# Patient Record
Sex: Female | Born: 1970 | Race: Asian | Hispanic: No | Marital: Single | Smoking: Former smoker
Health system: Southern US, Community
[De-identification: ages and names within clinical notes are randomized; demographics above are authoritative.]

## PROBLEM LIST (undated history)

## (undated) DIAGNOSIS — E119 Type 2 diabetes mellitus without complications: Secondary | ICD-10-CM

## (undated) DIAGNOSIS — Z9989 Dependence on other enabling machines and devices: Secondary | ICD-10-CM

## (undated) DIAGNOSIS — R7611 Nonspecific reaction to tuberculin skin test without active tuberculosis: Secondary | ICD-10-CM

## (undated) DIAGNOSIS — G4733 Obstructive sleep apnea (adult) (pediatric): Secondary | ICD-10-CM

## (undated) DIAGNOSIS — I10 Essential (primary) hypertension: Secondary | ICD-10-CM

## (undated) HISTORY — DX: Nonspecific reaction to tuberculin skin test without active tuberculosis: R76.11

## (undated) HISTORY — DX: Essential (primary) hypertension: I10

## (undated) HISTORY — DX: Type 2 diabetes mellitus without complications: E11.9

## (undated) HISTORY — DX: Obstructive sleep apnea (adult) (pediatric): G47.33

## (undated) HISTORY — DX: Dependence on other enabling machines and devices: Z99.89

## (undated) HISTORY — PX: EYE SURGERY: SHX253

## (undated) HISTORY — PX: DENTAL SURGERY: SHX609

---

## 2009-05-29 DIAGNOSIS — R7611 Nonspecific reaction to tuberculin skin test without active tuberculosis: Secondary | ICD-10-CM

## 2009-05-29 HISTORY — DX: Nonspecific reaction to tuberculin skin test without active tuberculosis: R76.11

## 2013-07-15 ENCOUNTER — Ambulatory Visit: Payer: Self-pay | Admitting: Internal Medicine

## 2014-04-26 ENCOUNTER — Ambulatory Visit: Payer: Self-pay | Admitting: Internal Medicine

## 2014-05-04 ENCOUNTER — Ambulatory Visit: Payer: Self-pay | Admitting: Internal Medicine

## 2014-06-14 ENCOUNTER — Ambulatory Visit: Payer: Self-pay | Admitting: Internal Medicine

## 2014-11-25 ENCOUNTER — Ambulatory Visit: Payer: Self-pay | Admitting: Internal Medicine

## 2014-12-22 ENCOUNTER — Other Ambulatory Visit (INDEPENDENT_AMBULATORY_CARE_PROVIDER_SITE_OTHER): Payer: BLUE CROSS/BLUE SHIELD

## 2014-12-22 ENCOUNTER — Ambulatory Visit (INDEPENDENT_AMBULATORY_CARE_PROVIDER_SITE_OTHER): Payer: BLUE CROSS/BLUE SHIELD | Admitting: Internal Medicine

## 2014-12-22 ENCOUNTER — Encounter: Payer: Self-pay | Admitting: Internal Medicine

## 2014-12-22 ENCOUNTER — Encounter (INDEPENDENT_AMBULATORY_CARE_PROVIDER_SITE_OTHER): Payer: Self-pay

## 2014-12-22 VITALS — BP 110/70 | HR 102 | Temp 98.6°F | Resp 18 | Ht 65.0 in | Wt 245.8 lb

## 2014-12-22 DIAGNOSIS — E119 Type 2 diabetes mellitus without complications: Secondary | ICD-10-CM | POA: Diagnosis not present

## 2014-12-22 DIAGNOSIS — E669 Obesity, unspecified: Secondary | ICD-10-CM

## 2014-12-22 DIAGNOSIS — Z23 Encounter for immunization: Secondary | ICD-10-CM

## 2014-12-22 DIAGNOSIS — Z9989 Dependence on other enabling machines and devices: Secondary | ICD-10-CM

## 2014-12-22 DIAGNOSIS — E1169 Type 2 diabetes mellitus with other specified complication: Secondary | ICD-10-CM

## 2014-12-22 DIAGNOSIS — I10 Essential (primary) hypertension: Secondary | ICD-10-CM

## 2014-12-22 DIAGNOSIS — G4733 Obstructive sleep apnea (adult) (pediatric): Secondary | ICD-10-CM | POA: Diagnosis not present

## 2014-12-22 LAB — LIPID PANEL
CHOLESTEROL: 217 mg/dL — AB (ref 0–200)
HDL: 63.9 mg/dL (ref >39.00)
LDL CALC: 136 mg/dL — AB (ref 0–99)
NonHDL: 152.89
TRIGLYCERIDES: 83 mg/dL (ref 0.0–149.0)
Total CHOL/HDL Ratio: 3
VLDL: 16.6 mg/dL (ref 0.0–40.0)

## 2014-12-22 LAB — CBC
HCT: 36.3 % (ref 36.0–46.0)
Hemoglobin: 11.6 g/dL — ABNORMAL LOW (ref 12.0–15.0)
MCHC: 32 g/dL (ref 30.0–36.0)
MCV: 70.9 fl — ABNORMAL LOW (ref 78.0–100.0)
Platelets: 446 10*3/uL — ABNORMAL HIGH (ref 150.0–400.0)
RBC: 5.11 Mil/uL (ref 3.87–5.11)
RDW: 18.3 % — ABNORMAL HIGH (ref 11.5–15.5)
WBC: 7.2 10*3/uL (ref 4.0–10.5)

## 2014-12-22 LAB — COMPREHENSIVE METABOLIC PANEL
ALBUMIN: 4 g/dL (ref 3.5–5.2)
ALT: 20 U/L (ref 0–35)
AST: 15 U/L (ref 0–37)
Alkaline Phosphatase: 101 U/L (ref 39–117)
BILIRUBIN TOTAL: 0.3 mg/dL (ref 0.2–1.2)
BUN: 11 mg/dL (ref 6–23)
CALCIUM: 9.3 mg/dL (ref 8.4–10.5)
CO2: 28 mEq/L (ref 19–32)
CREATININE: 0.75 mg/dL (ref 0.40–1.20)
Chloride: 99 mEq/L (ref 96–112)
GFR: 89.61 mL/min (ref >60.00)
Glucose, Bld: 95 mg/dL (ref 70–99)
Potassium: 3.9 mEq/L (ref 3.5–5.1)
Sodium: 135 mEq/L (ref 135–145)
Total Protein: 8.4 g/dL — ABNORMAL HIGH (ref 6.0–8.3)

## 2014-12-22 LAB — HEMOGLOBIN A1C: Hgb A1c MFr Bld: 6 % (ref 4.6–6.5)

## 2014-12-22 NOTE — Progress Notes (Signed)
Pre visit review using our clinic review tool, if applicable. No additional management support is needed unless otherwise documented below in the visit note. 

## 2014-12-22 NOTE — Patient Instructions (Signed)
We will check the blood work today and call you back with the results.   Diabetes and Standards of Medical Care Diabetes is complicated. You may find that your diabetes team includes a dietitian, nurse, diabetes educator, eye doctor, and more. To help everyone know what is going on and to help you get the care you deserve, the following schedule of care was developed to help keep you on track. Below are the tests, exams, vaccines, medicines, education, and plans you will need. HbA1c test This test shows how well you have controlled your glucose over the past 2-3 months. It is used to see if your diabetes management plan needs to be adjusted.   It is performed at least 2 times a year if you are meeting treatment goals.  It is performed 4 times a year if therapy has changed or if you are not meeting treatment goals. Blood pressure test  This test is performed at every routine medical visit. The goal is less than 140/90 mm Hg for most people, but 130/80 mm Hg in some cases. Ask your health care provider about your goal. Dental exam  Follow up with the dentist regularly. Eye exam  If you are diagnosed with type 1 diabetes as a child, get an exam upon reaching the age of 10 years or older and have had diabetes for 3-5 years. Yearly eye exams are recommended after that initial eye exam.  If you are diagnosed with type 1 diabetes as an adult, get an exam within 5 years of diagnosis and then yearly.  If you are diagnosed with type 2 diabetes, get an exam as soon as possible after the diagnosis and then yearly. Foot care exam  Visual foot exams are performed at every routine medical visit. The exams check for cuts, injuries, or other problems with the feet.  A comprehensive foot exam should be done yearly. This includes visual inspection as well as assessing foot pulses and testing for loss of sensation.  Check your feet nightly for cuts, injuries, or other problems with your feet. Tell your  health care provider if anything is not healing. Kidney function test (urine microalbumin)  This test is performed once a year.  Type 1 diabetes: The first test is performed 5 years after diagnosis.  Type 2 diabetes: The first test is performed at the time of diagnosis.  A serum creatinine and estimated glomerular filtration rate (eGFR) test is done once a year to assess the level of chronic kidney disease (CKD), if present. Lipid profile (cholesterol, HDL, LDL, triglycerides)  Performed every 5 years for most people.  The goal for LDL is less than 100 mg/dL. If you are at high risk, the goal is less than 70 mg/dL.  The goal for HDL is 40 mg/dL-50 mg/dL for men and 50 mg/dL-60 mg/dL for women. An HDL cholesterol of 60 mg/dL or higher gives some protection against heart disease.  The goal for triglycerides is less than 150 mg/dL. Influenza vaccine, pneumococcal vaccine, and hepatitis B vaccine  The influenza vaccine is recommended yearly.  It is recommended that people with diabetes who are over 65 years old get the pneumonia vaccine. In some cases, two separate shots may be given. Ask your health care provider if your pneumonia vaccination is up to date.  The hepatitis B vaccine is also recommended for adults with diabetes. Diabetes self-management education  Education is recommended at diagnosis and ongoing as needed. Treatment plan  Your treatment plan is reviewed at every   medical visit. Document Released: 01/13/2009 Document Revised: 08/02/2013 Document Reviewed: 08/18/2012 ExitCare Patient Information 2015 [employer]. This information is not intended to replace advice given to you by your health care provider. Make sure you discuss any questions you have with your health care provider.  

## 2014-12-24 DIAGNOSIS — Z9989 Dependence on other enabling machines and devices: Secondary | ICD-10-CM

## 2014-12-24 DIAGNOSIS — E118 Type 2 diabetes mellitus with unspecified complications: Secondary | ICD-10-CM | POA: Insufficient documentation

## 2014-12-24 DIAGNOSIS — I10 Essential (primary) hypertension: Secondary | ICD-10-CM | POA: Insufficient documentation

## 2014-12-24 DIAGNOSIS — G4733 Obstructive sleep apnea (adult) (pediatric): Secondary | ICD-10-CM | POA: Insufficient documentation

## 2014-12-24 DIAGNOSIS — E119 Type 2 diabetes mellitus without complications: Secondary | ICD-10-CM | POA: Insufficient documentation

## 2014-12-24 NOTE — Assessment & Plan Note (Signed)
Per her reports it is under good control. Foot exam done today and reminded about yearly eye exam. She is on ACE-I, not statin. Checking lipid panel today. No known complications. Talked with her about her weight as this is the driving factor for most of her health problems.

## 2014-12-24 NOTE — Assessment & Plan Note (Signed)
Sleeps much better with her CPAP and continue.

## 2014-12-24 NOTE — Assessment & Plan Note (Signed)
BP at goal on lisinopril/hctz. Checking CMP today and adjust as indicated. No known complications of this.

## 2014-12-24 NOTE — Progress Notes (Signed)
   Subjective:    Patient ID: Karen Peters, female    DOB: 05-28-1970, 44 y.o.   MRN: [id number]  HPI The patient is a 44 YO female coming in new for continuity of her medical care. She has diabetes (well controlled on metformin, not complicated that she knows of, on ACE-I), her blood pressure (controlled on lisinopril/hctz, no known complications) and her morbid obesity (she has struggled with it for years, taking the metformin which has helped some, hard to find time to exercise, complicated by OSA on CPAP as well as diabetes and hypertension). She does not have new complaints at this time. No side effects from her medicines.   PMH, [facility], social history reviewed and updated.   Review of Systems  Constitutional: Negative for fever, activity change, appetite change, fatigue and unexpected weight change.  HENT: Negative.   Eyes: Negative.   Respiratory: Negative for cough, chest tightness, shortness of breath and wheezing.   Cardiovascular: Negative for chest pain, palpitations and leg swelling.  Gastrointestinal: Negative for abdominal pain, diarrhea, constipation and abdominal distention.  Musculoskeletal: Negative.   Skin: Negative.   Neurological: Negative for dizziness, weakness, light-headedness, numbness and headaches.  Psychiatric/Behavioral: Negative.       Objective:   Physical Exam  Constitutional: She is oriented to person, place, and time. She appears well-developed and well-nourished.  Overweight  HENT:  Head: Normocephalic and atraumatic.  Eyes: EOM are normal.  Neck: Normal range of motion.  Cardiovascular: Normal rate and regular rhythm.   Pulmonary/Chest: Effort normal and breath sounds normal. No respiratory distress. She has no wheezes.  Abdominal: Soft. Bowel sounds are normal.  Musculoskeletal: She exhibits no edema.  Neurological: She is alert and oriented to person, place, and time. Coordination normal.  Skin: Skin is warm and dry.  Foot exam done    Psychiatric: She has a normal mood and affect.   Filed Vitals:   12/22/14 1451  BP: 110/70  Pulse: 102  Temp: 98.6 F (37 C)  TempSrc: Oral  Resp: 18  Height:  (1.651 m)  Weight: 245 lb 12.8 oz (111.494 kg)  SpO2: 98%      Assessment & Plan:  Flu shot given at visit.

## 2014-12-24 NOTE — Assessment & Plan Note (Signed)
Talked to her about the seriousness of this problem. Complicated by OSA, essential hypertension and diabetes. Taking metformin which has stabilized weight but she is not exercising. Counseled her on diet and exercise as part of a healthy life.

## 2015-05-01 ENCOUNTER — Other Ambulatory Visit: Payer: Self-pay | Admitting: Internal Medicine

## 2015-05-28 ENCOUNTER — Other Ambulatory Visit: Payer: Self-pay | Admitting: Internal Medicine

## 2015-06-05 ENCOUNTER — Ambulatory Visit: Payer: BLUE CROSS/BLUE SHIELD | Admitting: Internal Medicine

## 2015-06-06 ENCOUNTER — Ambulatory Visit (INDEPENDENT_AMBULATORY_CARE_PROVIDER_SITE_OTHER): Payer: 59 | Admitting: Internal Medicine

## 2015-06-06 ENCOUNTER — Encounter: Payer: Self-pay | Admitting: Internal Medicine

## 2015-06-06 ENCOUNTER — Other Ambulatory Visit (INDEPENDENT_AMBULATORY_CARE_PROVIDER_SITE_OTHER): Payer: 59

## 2015-06-06 VITALS — BP 100/60 | HR 109 | Temp 98.0°F | Resp 18 | Ht 65.0 in | Wt 254.0 lb

## 2015-06-06 DIAGNOSIS — G4733 Obstructive sleep apnea (adult) (pediatric): Secondary | ICD-10-CM | POA: Diagnosis not present

## 2015-06-06 DIAGNOSIS — E119 Type 2 diabetes mellitus without complications: Secondary | ICD-10-CM | POA: Diagnosis not present

## 2015-06-06 DIAGNOSIS — Z9989 Dependence on other enabling machines and devices: Secondary | ICD-10-CM

## 2015-06-06 LAB — HEMOGLOBIN A1C: Hgb A1c MFr Bld: 6.2 % (ref 4.6–6.5)

## 2015-06-06 MED ORDER — CYCLOBENZAPRINE HCL 10 MG PO TABS: 10.0000 mg | ORAL_TABLET | Freq: Three times a day (TID) | ORAL | Status: DC | PRN

## 2015-06-06 MED ORDER — LEVOCETIRIZINE DIHYDROCHLORIDE 5 MG PO TABS: 5.0000 mg | ORAL_TABLET | Freq: Every evening | ORAL | Status: DC

## 2015-06-06 NOTE — Progress Notes (Signed)
   Subjective:    Patient ID: Karen Peters, female    DOB: 04/14/1970, 45 y.o.   MRN: [id number]  HPI The patient is a 45 YO female coming in for follow up of her diabetes. She is still taking the metformin and her last HgA1c was good. She has gained some weight since that time due to her job requiring her to sit for about 10 hours per day. She also gets limited time for food only and is forced to eat food that it not good for her that she can prepare quickly. She is also not able to use her cpap right now due to allergies. This is causing her to get into coughing fit and she cannot keep it on. Also feels some of the fatigue coming back from before she used it.   Review of Systems  Constitutional: Negative for fever, activity change, appetite change, fatigue and unexpected weight change.  HENT: Positive for congestion, postnasal drip and rhinorrhea. Negative for sinus pressure, tinnitus, trouble swallowing and voice change.   Eyes: Negative.   Respiratory: Negative for cough, chest tightness, shortness of breath and wheezing.   Cardiovascular: Negative for chest pain, palpitations and leg swelling.  Gastrointestinal: Negative for abdominal pain, diarrhea, constipation and abdominal distention.  Musculoskeletal: Negative.   Skin: Negative.   Neurological: Negative for dizziness, weakness, light-headedness, numbness and headaches.  Psychiatric/Behavioral: Negative.       Objective:   Physical Exam  Constitutional: She is oriented to person, place, and time. She appears well-developed and well-nourished.  Overweight  HENT:  Head: Normocephalic and atraumatic.  Eyes: EOM are normal.  Neck: Normal range of motion.  Cardiovascular: Normal rate and regular rhythm.   Pulmonary/Chest: Effort normal and breath sounds normal. No respiratory distress. She has no wheezes.  Abdominal: Soft. Bowel sounds are normal.  Musculoskeletal: She exhibits no edema.  Neurological: She is alert and oriented  to person, place, and time. Coordination normal.  Skin: Skin is warm and dry.  Psychiatric: She has a normal mood and affect.   Filed Vitals:   06/06/15 1438  BP: 100/60  Pulse: 109  Temp: 98 F (36.7 C)  TempSrc: Oral  Resp: 18  Height: 5\' 5"  (1.651 m)  Weight: 254 lb (115.214 kg)  SpO2: 95%      Assessment & Plan:

## 2015-06-06 NOTE — Patient Instructions (Signed)
We will get you in with the nutritionist to talk to them about the weight and see if they can come up with some foods that will be easier to fix.   We are checking the labs today and will send the results on mychart.   We have sent in a medicine called xyzal for the allergies that you can take daily.   We have also sent in the flexeril for the pain that is not going to make you sleepy or drowsy.

## 2015-06-06 NOTE — Assessment & Plan Note (Signed)
Rx for xyzal for she can start wearing the cpap again to help with her fatigue symptoms.

## 2015-06-06 NOTE — Assessment & Plan Note (Signed)
Weight is up some from last visit. Talked to her about suggestions for getting in some exercise at her desk, also on days off.

## 2015-06-06 NOTE — Assessment & Plan Note (Signed)
On lisinopril and metformin. Checking HgA1c today and adjust as needed. Well controlled without complications.

## 2015-06-06 NOTE — Progress Notes (Signed)
Pre visit review using our clinic review tool, if applicable. No additional management support is needed unless otherwise documented below in the visit note. 

## 2015-06-08 ENCOUNTER — Encounter: Payer: Self-pay | Admitting: Internal Medicine

## 2015-06-24 ENCOUNTER — Other Ambulatory Visit: Payer: Self-pay | Admitting: Internal Medicine

## 2015-07-12 ENCOUNTER — Ambulatory Visit: Payer: 59 | Admitting: *Deleted

## 2015-07-17 ENCOUNTER — Other Ambulatory Visit: Payer: Self-pay

## 2015-07-17 MED ORDER — LISINOPRIL-HYDROCHLOROTHIAZIDE 10-12.5 MG PO TABS: 0.5000 | ORAL_TABLET | Freq: Every day | ORAL | Status: DC

## 2015-07-21 ENCOUNTER — Ambulatory Visit (HOSPITAL_BASED_OUTPATIENT_CLINIC_OR_DEPARTMENT_OTHER)
Admission: RE | Admit: 2015-07-21 | Discharge: 2015-07-21 | Disposition: A | Discharge status: Home / Self Care | Payer: 59 | Source: Ambulatory Visit | Attending: Family Medicine | Admitting: Family Medicine

## 2015-07-21 ENCOUNTER — Ambulatory Visit (INDEPENDENT_AMBULATORY_CARE_PROVIDER_SITE_OTHER): Payer: 59

## 2015-07-21 ENCOUNTER — Ambulatory Visit (INDEPENDENT_AMBULATORY_CARE_PROVIDER_SITE_OTHER): Payer: 59 | Admitting: Family Medicine

## 2015-07-21 VITALS — BP 128/86 | HR 93 | Temp 98.0°F | Resp 18 | Ht 64.5 in | Wt 257.0 lb

## 2015-07-21 DIAGNOSIS — R1011 Right upper quadrant pain: Secondary | ICD-10-CM

## 2015-07-21 DIAGNOSIS — R350 Frequency of micturition: Secondary | ICD-10-CM

## 2015-07-21 DIAGNOSIS — R932 Abnormal findings on diagnostic imaging of liver and biliary tract: Secondary | ICD-10-CM | POA: Diagnosis not present

## 2015-07-21 LAB — COMPREHENSIVE METABOLIC PANEL
ALK PHOS: 107 U/L (ref 33–115)
ALT: 38 U/L — AB (ref 6–29)
AST: 22 U/L (ref 10–30)
Albumin: 4 g/dL (ref 3.6–5.1)
BUN: 11 mg/dL (ref 7–25)
CALCIUM: 8.9 mg/dL (ref 8.6–10.2)
CO2: 26 mmol/L (ref 20–31)
Chloride: 99 mmol/L (ref 98–110)
Creat: 0.69 mg/dL (ref 0.50–1.10)
GLUCOSE: 93 mg/dL (ref 65–99)
POTASSIUM: 3.9 mmol/L (ref 3.5–5.3)
Sodium: 136 mmol/L (ref 135–146)
TOTAL PROTEIN: 7.5 g/dL (ref 6.1–8.1)
Total Bilirubin: 0.3 mg/dL (ref 0.2–1.2)

## 2015-07-21 LAB — POCT CBC
Granulocyte percent: 56.4 %G (ref 37–80)
HEMATOCRIT: 35.8 % — AB (ref 37.7–47.9)
HEMOGLOBIN: 12 g/dL — AB (ref 12.2–16.2)
LYMPH, POC: 2.8 (ref 0.6–3.4)
MCH, POC: 25.9 pg — AB (ref 27–31.2)
MCHC: 33.6 g/dL (ref 31.8–35.4)
MCV: 77 fL — AB (ref 80–97)
MID (cbc): 0.6 (ref 0–0.9)
MPV: 7.2 fL (ref 0–99.8)
POC GRANULOCYTE: 4.5 (ref 2–6.9)
POC LYMPH PERCENT: 35.5 %L (ref 10–50)
POC MID %: 8.1 % (ref 0–12)
Platelet Count, POC: 295 10*3/uL (ref 142–424)
RBC: 4.65 M/uL (ref 4.04–5.48)
RDW, POC: 16.3 %
WBC: 8 10*3/uL (ref 4.6–10.2)

## 2015-07-21 LAB — POC MICROSCOPIC URINALYSIS (UMFC): MUCUS RE: ABSENT

## 2015-07-21 LAB — POCT URINALYSIS DIP (MANUAL ENTRY)
Bilirubin, UA: NEGATIVE
GLUCOSE UA: NEGATIVE
Ketones, POC UA: NEGATIVE
NITRITE UA: NEGATIVE
Protein Ur, POC: NEGATIVE
Spec Grav, UA: 1.015
UROBILINOGEN UA: 0.2
pH, UA: 5.5

## 2015-07-21 LAB — POCT URINE PREGNANCY: Preg Test, Ur: NEGATIVE

## 2015-07-21 LAB — LIPASE: LIPASE: 14 U/L (ref 7–60)

## 2015-07-21 MED ORDER — HYDROCODONE-ACETAMINOPHEN 5-325 MG PO TABS: 1.0000 | ORAL_TABLET | Freq: Four times a day (QID) | ORAL | Status: DC | PRN

## 2015-07-21 MED ORDER — KETOROLAC TROMETHAMINE 60 MG/2ML IM SOLN
60.0000 mg | Freq: Once | INTRAMUSCULAR | Status: AC
  Administered 2015-07-21: 60 mg via INTRAMUSCULAR

## 2015-07-21 NOTE — Progress Notes (Addendum)
Subjective:    Patient ID: Karen Peters, female    DOB: [birth year], 45 y.o.   MRN: [id number] By signing my name below, I, Karen Peters, attest that this documentation has been prepared under the direction and in the presence of Karen SorensonEva Carra Brindley, MD.  Electronically Signed: Littie Deedsichard Peters, Medical Scribe. 07/21/2015. 3:19 PM.  Chief Complaint  Patient presents with  . Abdominal Pain    right side radiating to back and lower abdomen, frequent urination    HPI HPI Comments: Karen SerRachelle Ruegg is a 45 y.o. female who presents to the [facility] complaining of intermittent, shooting right flank pain radiating around to her back and abdomen, underneath the breast, that started yesterday evening after dinner (she had fried chicken for dinner). Patient also reports having mild dizziness. She does not have any pain when she remains still. The pain is worse with movement and deep breathing. She tried tramadol around 1:00 AM this morning without relief. She has not tried ibuprofen. Patient denies nausea and blood in stool. No prior abdominal surgeries per patient. She has been having normal bowel movements. She has been drinking fluids normally, but she notes that her urine has appeared darker than normal. She has not eaten since dinner last night. Patient states she had a minor fall a few days ago after she had slipped and hit her left side against a part of a chair, but she did not have any pain to her right side as a result. She has not had a recent pelvic exam. Patient states she is currently being treated for uterine fibroids by her PCP. She is not on any birth control. She is not married nor sexually active, and she denies possibility of pregnancy.   Depression screen PHQ 2/9 07/21/2015  Decreased Interest 0  Down, Depressed, Hopeless 0  PHQ - 2 Score 0      Past Medical History  Diagnosis Date  . Diabetes mellitus without complication (HCC)   . Hypertension   . 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 OSA on CPAP    Past Surgical History  Procedure Laterality Date  . Eye surgery    . Dental surgery     Current Outpatient Prescriptions on File Prior to Visit  Medication Sig Dispense Refill  . clobetasol (TEMOVATE) 0.05 % external solution Apply 1 application topically.     Marland Kitchen. levocetirizine (XYZAL) 5 MG tablet Take 1 tablet (5 mg total) by mouth every evening. 30 tablet 3  . lisinopril-hydrochlorothiazide (PRINZIDE,ZESTORETIC) 10-12.5 MG tablet Take 0.5 tablets by mouth daily. 15 tablet 2  . metFORMIN (GLUCOPHAGE) 500 MG tablet TAKE 1 TABLET BY MOUTH EVERY DAY 30 tablet 1  . traMADol (ULTRAM) 50 MG tablet Take 50 mg by mouth every 6 (six) hours as needed.      No current facility-administered medications on file prior to visit.   No Known Allergies Family History  Problem Relation Age of Onset  . Hypertension Mother   . Diabetes Mother   . Hyperlipidemia Mother   . Stroke Maternal Uncle   . Cancer Maternal Grandmother    Social History   Social History  . Marital Status: Single    Spouse Name: N/A  . Number of Children: N/A  . Years of Education: N/A   Social History Main Topics  . Smoking status: Former Games developermoker  . Smokeless tobacco: None  . Alcohol Use: 0.0 oz/week    0 Standard drinks or equivalent per week  . Drug Use:  No  . Sexual Activity: Not Asked   Other Topics Concern  . None   Social History Narrative     Review of Systems  Constitutional: Positive for appetite change and fatigue. Negative for fever, chills, diaphoresis and activity change.  Respiratory: Positive for chest tightness.   Cardiovascular: Positive for chest pain.  Gastrointestinal: Positive for abdominal pain. Negative for nausea, diarrhea, constipation and blood in stool.  Genitourinary: Positive for hematuria, flank pain, vaginal bleeding and menstrual problem.  Musculoskeletal: Positive for back pain.  Neurological: Positive for dizziness.  Hematological: Negative for  adenopathy. Does not bruise/bleed easily.  Psychiatric/Behavioral: Negative for dysphoric mood.       Objective:  BP 128/86 mmHg  Pulse 93  Temp(Src) 98 F (36.7 C) (Oral)  Resp 18  Ht 5' 4.5" (1.638 m)  Wt 257 lb (116.574 kg)  BMI 43.45 kg/m2  SpO2 97%  LMP 06/23/2015  Physical Exam  Constitutional: She is oriented to person, place, and time. She appears well-developed and well-nourished. No distress.  HENT:  Head: Normocephalic and atraumatic.  Mouth/Throat: Oropharynx is clear and moist. No oropharyngeal exudate.  Eyes: Pupils are equal, round, and reactive to light.  Neck: Neck supple. No thyromegaly present.  Thyroid normal.  Cardiovascular: Normal rate, regular rhythm, S1 normal, S2 normal and normal heart sounds.   No murmur heard. Pulmonary/Chest: Effort normal and breath sounds normal. No respiratory distress. She has no wheezes. She has no rales.  Clear to auscultation bilaterally. Good air movement.  Abdominal: There is tenderness. There is no CVA tenderness.  Hypoactive to absent bowel sounds. Diffuse generalized tenderness sparing the periumbilical and epigastric area.  Genitourinary:  Normal labia bimanual exam. No CMT. No tenderness with uterine or adnexa palpation. Body habitus inhibits assessment of masses or enlargement.  Musculoskeletal: She exhibits no edema.  Small muscle spasm over the upper right flank, base of scapula.  Neurological: She is alert and oriented to person, place, and time. No cranial nerve deficit.  Skin: Skin is warm and dry. No rash noted.  Psychiatric: She has a normal mood and affect. Her behavior is normal.  Nursing note and vitals reviewed.         Assessment & Plan:   1. Frequent urination   2. Abdominal pain, right upper quadrant   Suspect gallstones but could be MSK  Orders Placed This Encounter  Procedures  . DG Abd Acute W/Chest    Standing Status: Future     Number of Occurrences: 1     Standing Expiration Date:  07/20/2016    Order Specific Question:  Reason for exam:    Answer:  right flank pain, diffuse abd pain, suspect cholecystitis    Order Specific Question:  Is the patient pregnant?    Answer:  No    Order Specific Question:  Preferred imaging location?    Answer:  External  . US Abdomen Limited RUQ    Standing Status: Future     Number of Occurrences: 1     Standing Expiration Date: 09/19/2016    Order Specific Question:  Reason for Exam (SYMPTOM  OR DIAGNOSIS REQUIRED)    Answer:  SUSPECT CHOLECYSTITIS    Order Specific Question:  Preferred imaging location?    Answer:  External  . Comprehensive metabolic panel  . Lipase  . POCT Microscopic Urinalysis ([facility])  . POCT urinalysis dipstick  . POCT CBC  . POCT urine pregnancy    Meds ordered this encounter  Medications  . ketorolac (  TORADOL) injection 60 mg    Sig:   . HYDROcodone-acetaminophen (NORCO/VICODIN) 5-325 MG tablet    Sig: Take 1-2 tablets by mouth every 6 (six) hours as needed for moderate pain.    Dispense:  20 tablet    Refill:  0  . cyclobenzaprine (FLEXERIL) 10 MG tablet    Sig: Take 1 tablet (10 mg total) by mouth 3 (three) times daily as needed for muscle spasms.    Dispense:  30 tablet    Refill:  0    I personally performed the services described in this documentation, which was scribed in my presence. The recorded information has been reviewed and considered, and addended by me as needed.  Karen Sorenson, MD MPH

## 2015-07-21 NOTE — Patient Instructions (Addendum)
Go to [facility] for outpatient U/S. Register at outpatient side if they are still open when you get there. If not register at the emergency department as OUTPATIENT U/S. DO NOT REGISTER AS ED PATIENT     IF you received an x-ray today, you will receive an invoice from [facility]. Please contact [facility] at [phone] with questions or concerns regarding your invoice.   IF you received labwork today, you will receive an invoice from [employer] [facility]. Please contact [facility] at [phone] with questions or concerns regarding your invoice.   Our billing staff will not be able to assist you with questions regarding bills from these companies.  You will be contacted with the lab results as soon as they are available. The fastest way to get your results is to activate your My Chart account. Instructions are located on the last page of this paperwork. If you have not heard from [place] regarding the results in 2 weeks, please contact this office.    Low-Fat Diet for Pancreatitis or Gallbladder Conditions A low-fat diet can be helpful if you have pancreatitis or a gallbladder condition. With these conditions, your pancreas and gallbladder have trouble digesting fats. A healthy eating plan with less fat will help rest your pancreas and gallbladder and reduce your symptoms. WHAT DO I NEED TO KNOW ABOUT THIS DIET?  Eat a low-fat diet.  Reduce your fat intake to less than 20-30% of your total daily calories. This is less than 50-60 g of fat per day.  Remember that you need some fat in your diet. Ask your dietician what your daily goal should be.  Choose nonfat and low-fat healthy foods. Look for the words "nonfat," "low fat," or "fat free."  As a guide, look on the label and choose foods with less than 3 g of fat per serving. Eat only one serving.  Avoid alcohol.  Do not smoke. If you need help quitting, talk with your health care provider.  Eat  small frequent meals instead of three large heavy meals. WHAT FOODS CAN I EAT? Grains Include healthy grains and starches such as potatoes, wheat bread, fiber-rich cereal, and brown rice. Choose whole grain options whenever possible. In adults, whole grains should account for 45-65% of your daily calories.  Fruits and Vegetables Eat plenty of fruits and vegetables. Fresh fruits and vegetables add fiber to your diet. Meats and Other Protein Sources Eat lean meat such as chicken and pork. Trim any fat off of meat before cooking it. Eggs, fish, and beans are other sources of protein. In adults, these foods should account for 10-35% of your daily calories. Dairy Choose low-fat milk and dairy options. Dairy includes fat and protein, as well as calcium.  Fats and Oils Limit high-fat foods such as fried foods, sweets, baked goods, sugary drinks.  Other Creamy sauces and condiments, such as mayonnaise, can add extra fat. Think about whether or not you need to use them, or use smaller amounts or low fat options. WHAT FOODS ARE NOT RECOMMENDED?  High fat foods, such as:  [employer].  Ice cream.  [place] toast.  Sweet rolls.  Pizza.  Cheese bread.  Foods covered with batter, butter, creamy sauces, or cheese.  Fried foods.  Sugary drinks and desserts.  Foods that cause gas or bloating   This information is not intended to replace advice given to you by your health care provider. Make sure you discuss any questions you have with your health care  provider.   Document Released: 03/23/2013 Document Reviewed: 03/23/2013 Elsevier Interactive Patient Education [employer]2016 Elsevier Inc.

## 2015-07-22 ENCOUNTER — Telehealth: Payer: Self-pay

## 2015-07-22 ENCOUNTER — Encounter: Payer: Self-pay | Admitting: Family Medicine

## 2015-07-22 MED ORDER — CYCLOBENZAPRINE HCL 10 MG PO TABS: 10.0000 mg | ORAL_TABLET | Freq: Three times a day (TID) | ORAL | Status: DC | PRN

## 2015-07-22 NOTE — Telephone Encounter (Signed)
Pt needs a doctors note that says she was seen Friday and was out of work for that reason.  She needs this for Monday if possible.  She also put in a MyChart message and wanted to know if we could send the letter through it.  [phone]

## 2015-07-25 NOTE — Telephone Encounter (Signed)
Letter printed. Advised to call back. I may need a fax number instead.

## 2015-08-25 ENCOUNTER — Encounter: Payer: Self-pay | Admitting: Internal Medicine

## 2015-08-28 ENCOUNTER — Other Ambulatory Visit: Payer: Self-pay | Admitting: Internal Medicine

## 2015-09-02 ENCOUNTER — Encounter: Payer: Self-pay | Admitting: Internal Medicine

## 2015-09-04 ENCOUNTER — Other Ambulatory Visit: Payer: Self-pay | Admitting: Geriatric Medicine

## 2015-09-04 MED ORDER — METFORMIN HCL 500 MG PO TABS: 500.0000 mg | ORAL_TABLET | Freq: Every day | ORAL | Status: DC

## 2015-09-04 MED ORDER — LISINOPRIL-HYDROCHLOROTHIAZIDE 10-12.5 MG PO TABS: 0.5000 | ORAL_TABLET | Freq: Every day | ORAL | Status: DC

## 2015-09-04 MED ORDER — LEVOCETIRIZINE DIHYDROCHLORIDE 5 MG PO TABS
5.0000 mg | ORAL_TABLET | Freq: Every evening | ORAL | Status: DC
Start: 2015-09-04 — End: 2016-09-28

## 2015-12-01 ENCOUNTER — Other Ambulatory Visit (INDEPENDENT_AMBULATORY_CARE_PROVIDER_SITE_OTHER): Payer: 59

## 2015-12-01 ENCOUNTER — Encounter: Payer: Self-pay | Admitting: Internal Medicine

## 2015-12-01 ENCOUNTER — Ambulatory Visit (INDEPENDENT_AMBULATORY_CARE_PROVIDER_SITE_OTHER): Payer: 59 | Admitting: Internal Medicine

## 2015-12-01 VITALS — BP 110/76 | HR 72 | Temp 98.1°F | Resp 12 | Ht 65.0 in | Wt 246.0 lb

## 2015-12-01 DIAGNOSIS — E119 Type 2 diabetes mellitus without complications: Secondary | ICD-10-CM

## 2015-12-01 DIAGNOSIS — Z23 Encounter for immunization: Secondary | ICD-10-CM | POA: Diagnosis not present

## 2015-12-01 LAB — COMPREHENSIVE METABOLIC PANEL
ALBUMIN: 4.1 g/dL (ref 3.5–5.2)
ALK PHOS: 109 U/L (ref 39–117)
ALT: 39 U/L — ABNORMAL HIGH (ref 0–35)
AST: 26 U/L (ref 0–37)
BUN: 10 mg/dL (ref 6–23)
CALCIUM: 9.3 mg/dL (ref 8.4–10.5)
CHLORIDE: 98 meq/L (ref 96–112)
CO2: 31 mEq/L (ref 19–32)
CREATININE: 0.75 mg/dL (ref 0.40–1.20)
GFR: 89.22 mL/min (ref >60.00)
Glucose, Bld: 108 mg/dL — ABNORMAL HIGH (ref 70–99)
Potassium: 3.7 mEq/L (ref 3.5–5.1)
Sodium: 136 mEq/L (ref 135–145)
TOTAL PROTEIN: 8.2 g/dL (ref 6.0–8.3)
Total Bilirubin: 0.4 mg/dL (ref 0.2–1.2)

## 2015-12-01 LAB — LIPID PANEL
CHOLESTEROL: 232 mg/dL — AB (ref 0–200)
HDL: 52 mg/dL (ref >39.00)
LDL CALC: 156 mg/dL — AB (ref 0–99)
NonHDL: 180.4
TRIGLYCERIDES: 122 mg/dL (ref 0.0–149.0)
Total CHOL/HDL Ratio: 4
VLDL: 24.4 mg/dL (ref 0.0–40.0)

## 2015-12-01 LAB — HEMOGLOBIN A1C: Hgb A1c MFr Bld: 6.2 % (ref 4.6–6.5)

## 2015-12-01 NOTE — Assessment & Plan Note (Signed)
She is down about 10 pounds and working on portion control which is helping. Exercising some and encouraged her to continue.

## 2015-12-01 NOTE — Progress Notes (Signed)
Pre visit review using our clinic review tool, if applicable. No additional management support is needed unless otherwise documented below in the visit note. 

## 2015-12-01 NOTE — Patient Instructions (Signed)
We have given you the flu shot today.  We are checking the blood work and if the sugars are good we will decrease the metformin to 1/2 pill a day.

## 2015-12-01 NOTE — Progress Notes (Signed)
   Subjective:    Patient ID: Karen Peters, female    DOB: 29-Sep-1970, 45 y.o.   MRN: [id number]  HPI The patient is a 45 YO female coming in for follow up of her diabetes. She is still having some GI side effects from the metformin. She has moved the dose to evening with her job but still having some loose stools. She will keep taking it as she wants to control her sugars. No burning or numbness in her feet. No chest pains or SOB.   Review of Systems  Constitutional: Negative for activity change, appetite change, fatigue, fever and unexpected weight change.  HENT: Positive for congestion. Negative for postnasal drip, rhinorrhea, sinus pressure, tinnitus, trouble swallowing and voice change.   Eyes: Negative.   Respiratory: Negative for cough, chest tightness, shortness of breath and wheezing.   Cardiovascular: Negative for chest pain, palpitations and leg swelling.  Gastrointestinal: Negative for abdominal distention, abdominal pain, constipation and diarrhea.  Musculoskeletal: Negative.   Neurological: Negative for dizziness, weakness, light-headedness, numbness and headaches.      Objective:   Physical Exam  Constitutional: She is oriented to person, place, and time. She appears well-developed and well-nourished.  Overweight  HENT:  Head: Normocephalic and atraumatic.  Eyes: EOM are normal.  Neck: Normal range of motion.  Cardiovascular: Normal rate and regular rhythm.   Pulmonary/Chest: Effort normal and breath sounds normal. No respiratory distress. She has no wheezes.  Abdominal: Soft. Bowel sounds are normal. She exhibits no distension. There is no tenderness.  Musculoskeletal: She exhibits no edema.  Neurological: She is alert and oriented to person, place, and time. Coordination normal.  Skin: Skin is warm and dry.   Vitals:   12/01/15 1042  BP: 110/76  Pulse: 72  Resp: 12  Temp: 98.1 F (36.7 C)  TempSrc: Oral  SpO2: 98%  Weight: 246 lb (111.6 kg)  Height: 5\' 5"   (1.651 m)      Assessment & Plan:  Flu shot given at visit.

## 2015-12-01 NOTE — Assessment & Plan Note (Signed)
Checking HgA1c, CMP, lipid panel and adjust as needed. Taking metformin 500 mg qhs and still having some stomach symptoms. If HgA1c still <6.5 will reduce to 250 mg nightly. Not complicated.

## 2016-04-16 DIAGNOSIS — J209 Acute bronchitis, unspecified: Secondary | ICD-10-CM | POA: Diagnosis not present

## 2016-06-05 ENCOUNTER — Encounter: Payer: Self-pay | Admitting: Internal Medicine

## 2016-06-05 ENCOUNTER — Ambulatory Visit (INDEPENDENT_AMBULATORY_CARE_PROVIDER_SITE_OTHER): Payer: 59 | Admitting: Internal Medicine

## 2016-06-05 ENCOUNTER — Other Ambulatory Visit (INDEPENDENT_AMBULATORY_CARE_PROVIDER_SITE_OTHER): Payer: 59

## 2016-06-05 VITALS — BP 130/70 | HR 89 | Temp 98.1°F | Ht 65.0 in | Wt 255.0 lb

## 2016-06-05 DIAGNOSIS — E119 Type 2 diabetes mellitus without complications: Secondary | ICD-10-CM

## 2016-06-05 LAB — HEMOGLOBIN A1C: HEMOGLOBIN A1C: 6.2 % (ref 4.6–6.5)

## 2016-06-05 NOTE — Patient Instructions (Signed)
We will have you stop the metformin and if needed we can send in another medicine to replace it.   We will check the labs today.   Buspar is a very safe medicine we can try if you need it.

## 2016-06-05 NOTE — Assessment & Plan Note (Signed)
Checking HgA1c today, will stop metformin and if HgA1c high can restart a different medication. Not complicated. Foot exam done.

## 2016-06-05 NOTE — Progress Notes (Signed)
Pre visit review using our clinic review tool, if applicable. No additional management support is needed unless otherwise documented below in the visit note. 

## 2016-06-05 NOTE — Progress Notes (Signed)
   Subjective:    Patient ID: Karen Peters, female    DOB: [birth year], 46 y.o.   MRN: [id number]  HPI The patient is a 46 YO female coming in for follow up of her diabetes (taking metformin, on ACE-I, not complicated). She denies numbness or burning in her feet. 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 She is not wanting to try anything for this but maybe if it does not go away. This is causing her some diarrhea and she wonders if her metformin could be making this worse.   Review of Systems  Constitutional: Negative.   Respiratory: Negative.   Cardiovascular: Negative.   Gastrointestinal: Positive for diarrhea. Negative for abdominal distention, abdominal pain, constipation, nausea and vomiting.  Musculoskeletal: Negative.   Neurological: Negative.   Psychiatric/Behavioral: Positive for dysphoric mood. The patient is nervous/anxious.       Objective:   Physical Exam  Constitutional: She is oriented to person, place, and time. She appears well-developed and well-nourished.  HENT:  Head: Normocephalic and atraumatic.  Eyes: EOM are normal.  Neck: Normal range of motion.  Cardiovascular: Normal rate and regular rhythm.   Pulmonary/Chest: Effort normal and breath sounds normal. No respiratory distress. She has no wheezes. She has no rales.  Abdominal: Soft. Bowel sounds are normal. She exhibits no distension. There is no tenderness. There is no rebound.  Musculoskeletal: She exhibits no edema.  Neurological: She is alert and oriented to person, place, and time. Coordination normal.  Skin: Skin is warm and dry.  See foot exam   Vitals:   06/05/16 1004  BP: 130/70  Pulse: 89  Temp: 98.1 F (36.7 C)  TempSrc: Oral  SpO2: 99%  Weight: 255 lb (115.7 kg)  Height: 5\' 5"  (1.651 m)      Assessment & Plan:

## 2016-07-03 ENCOUNTER — Encounter: Payer: Self-pay | Admitting: Internal Medicine

## 2016-07-04 MED ORDER — METFORMIN HCL ER 500 MG PO TB24: 500.0000 mg | ORAL_TABLET | Freq: Every day | ORAL | 3 refills | Status: DC

## 2016-07-30 ENCOUNTER — Encounter: Payer: Self-pay | Admitting: Internal Medicine

## 2016-07-30 ENCOUNTER — Telehealth: Payer: Self-pay | Admitting: Emergency Medicine

## 2016-07-30 ENCOUNTER — Ambulatory Visit (INDEPENDENT_AMBULATORY_CARE_PROVIDER_SITE_OTHER): Payer: 59 | Admitting: Internal Medicine

## 2016-07-30 VITALS — BP 132/84 | HR 100 | Temp 98.6°F | Resp 12 | Ht 65.0 in | Wt 252.0 lb

## 2016-07-30 DIAGNOSIS — J0110 Acute frontal sinusitis, unspecified: Secondary | ICD-10-CM | POA: Diagnosis not present

## 2016-07-30 DIAGNOSIS — R350 Frequency of micturition: Secondary | ICD-10-CM

## 2016-07-30 LAB — POC URINALSYSI DIPSTICK (AUTOMATED)
BILIRUBIN UA: NEGATIVE
Blood, UA: NEGATIVE
Glucose, UA: NEGATIVE
KETONES UA: NEGATIVE
LEUKOCYTES UA: NEGATIVE
NITRITE UA: NEGATIVE
Protein, UA: NEGATIVE
Spec Grav, UA: 1.015 (ref 1.010–1.025)
Urobilinogen, UA: 0.2 E.U./dL
pH, UA: 6 (ref 5.0–8.0)

## 2016-07-30 MED ORDER — METFORMIN HCL ER 500 MG PO TB24: 500.0000 mg | ORAL_TABLET | Freq: Every day | ORAL | 3 refills | Status: DC

## 2016-07-30 MED ORDER — PREDNISONE 20 MG PO TABS: 40.0000 mg | ORAL_TABLET | Freq: Every day | ORAL | 0 refills | Status: DC

## 2016-07-30 NOTE — Patient Instructions (Signed)
We have sent in the prednisone for the sinuses. Take 2 pills a day for 5 days.   You can take the benadryl at night time to help with symptoms.

## 2016-07-30 NOTE — Progress Notes (Signed)
   Subjective:    Patient ID: Karen Peters, female    DOB: 05-24-1970, 46 y.o.   MRN: [id number]  HPI The patient is a 46 YO female coming in for UTI symptoms for about 3 days. Having some frequency, no urgency, no burning. No abdominal pain, no flank or back pain. Overall stable to mildly improving. Denies blood in urine or changes.  She is also having some severe allergy symptoms for about 2 weeks. She denies fevers but is having chills for the last week or so. She is having sinus pressure and pain with headaches. Taking some ibuprofen for headaches which is helping only minimally. She is taking xyzal daily and has not missed. [place] the [place] this weekend and she is not sure if she was exposed to different allergens. She is having a lot of nose drainage as well which is mostly clear but some yellow.   Review of Systems  Constitutional: Positive for activity change and chills. Negative for appetite change, diaphoresis, fatigue, fever and unexpected weight change.  HENT: Positive for congestion, ear pain, postnasal drip, rhinorrhea, sinus pain, sinus pressure and sore throat. Negative for ear discharge, nosebleeds and trouble swallowing.   Eyes: Negative.   Respiratory: Positive for cough. Negative for chest tightness, shortness of breath and wheezing.   Cardiovascular: Negative.   Gastrointestinal: Negative.   Musculoskeletal: Negative.   Skin: Negative.   Neurological: Negative.   Psychiatric/Behavioral: Negative.       Objective:   Physical Exam  Constitutional: She is oriented to person, place, and time. She appears well-developed and well-nourished.  HENT:  Head: Normocephalic and atraumatic.  Frontal sinus pressure, TMs bulging, oropharynx with redness and drainage, nose with crusting.   Eyes: EOM are normal.  Neck: Normal range of motion.  Cardiovascular: Normal rate and regular rhythm.   Pulmonary/Chest: Effort normal and breath sounds normal. No respiratory  distress. She has no wheezes. She has no rales.  Abdominal: Soft. She exhibits no distension. There is no tenderness. There is no rebound.  Musculoskeletal: She exhibits no edema.  Neurological: She is alert and oriented to person, place, and time. Coordination normal.  Skin: Skin is warm and dry.   Vitals:   07/30/16 1612  BP: 132/84  Pulse: 100  Resp: 12  Temp: 98.6 F (37 C)  TempSrc: Oral  SpO2: 99%  Weight: 252 lb (114.3 kg)  Height:  (1.651 m)      Assessment & Plan:

## 2016-07-30 NOTE — Telephone Encounter (Signed)
Pt asked if she can get a refill on her traMADol (ULTRAM) 50 MG tablet. It hasnt been filled in a while so if she needs an appt she will make one. Please advise thanks.

## 2016-07-30 NOTE — Progress Notes (Signed)
Pre visit review using our clinic review tool, if applicable. No additional management support is needed unless otherwise documented below in the visit note. 

## 2016-07-31 DIAGNOSIS — J329 Chronic sinusitis, unspecified: Secondary | ICD-10-CM | POA: Insufficient documentation

## 2016-07-31 DIAGNOSIS — R350 Frequency of micturition: Secondary | ICD-10-CM | POA: Insufficient documentation

## 2016-07-31 NOTE — Assessment & Plan Note (Signed)
Rx for prednisone for persistent allergy symptoms. No clear indication for antibiotics today and will hold off and advised to continue taking xyzal daily.

## 2016-07-31 NOTE — Assessment & Plan Note (Signed)
U/A done in the office today which is without signs of infection. No treatment indication and advised to increase water intake to help.

## 2016-07-31 NOTE — Telephone Encounter (Signed)
LVM stating she needs a visit

## 2016-07-31 NOTE — Telephone Encounter (Signed)
Pease advise.

## 2016-07-31 NOTE — Telephone Encounter (Signed)
Since we have never filled would need visit as this is controlled.

## 2016-09-28 ENCOUNTER — Other Ambulatory Visit: Payer: Self-pay | Admitting: Internal Medicine

## 2016-10-01 IMAGING — CR DG ABDOMEN ACUTE W/ 1V CHEST
4 series · 4 of 4 positions shown · non-contrast
Comparison: None.

CLINICAL DATA: 44-year-old female with a history of right-sided
abdominal pain.

EXAM:
DG ABDOMEN ACUTE W/ 1V CHEST

[PA]
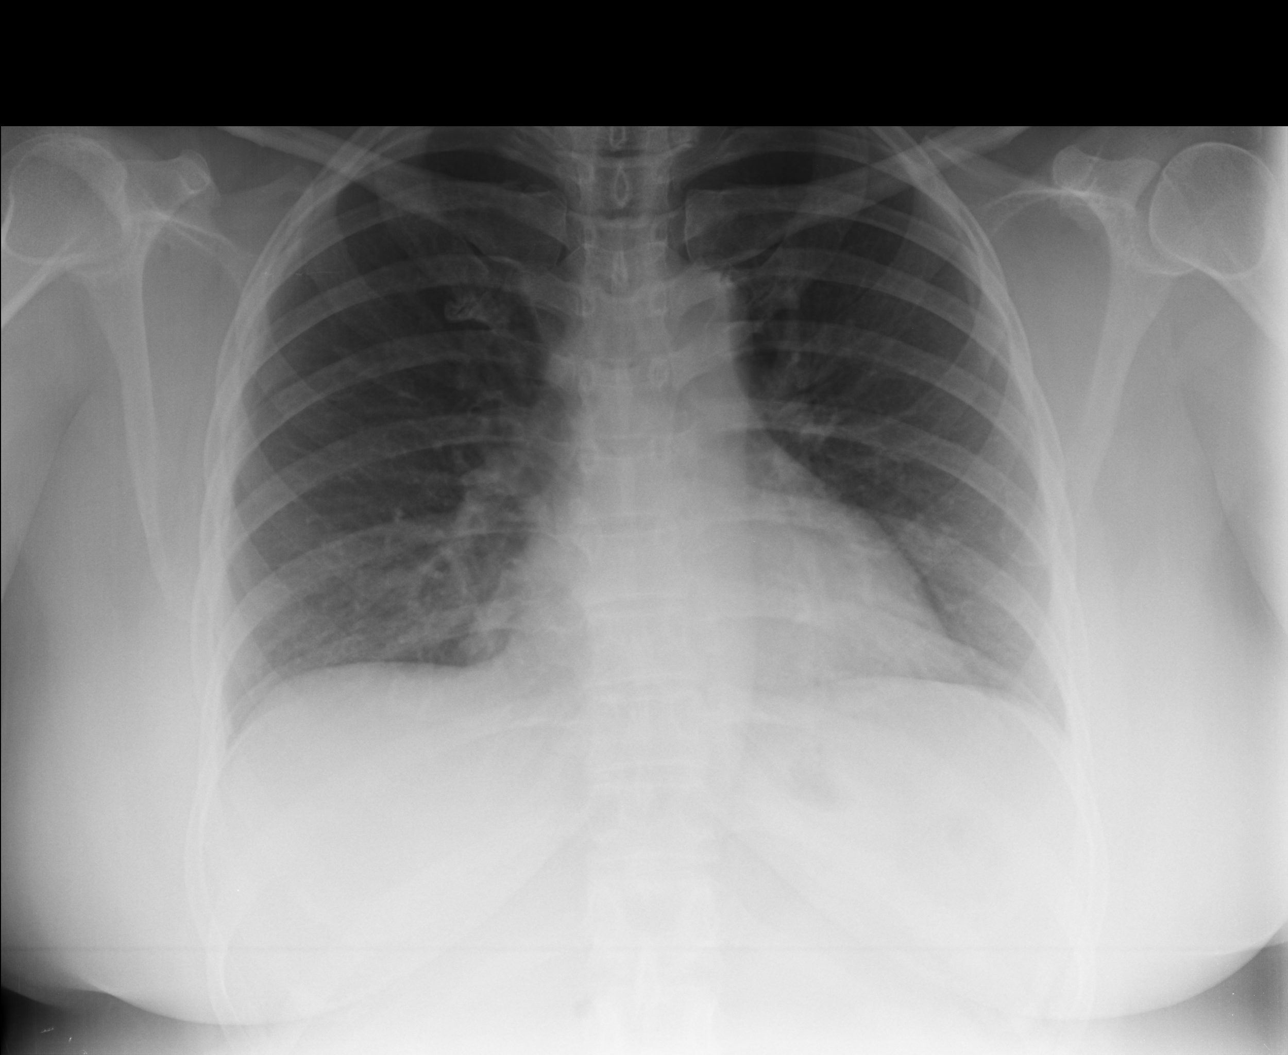

[AP (1 of 3)]
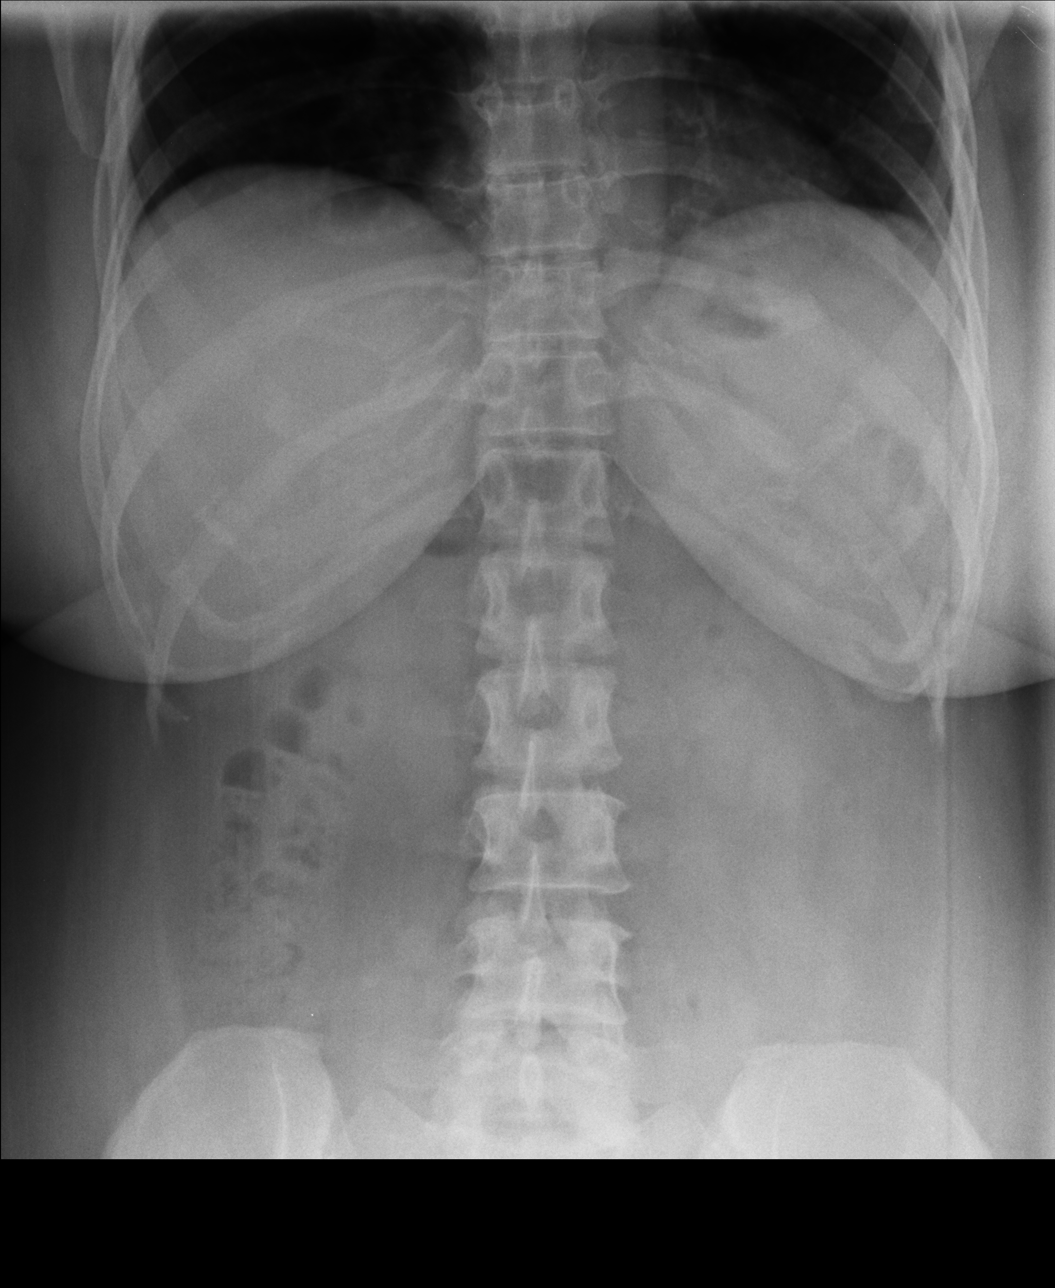

[AP (2 of 3)]
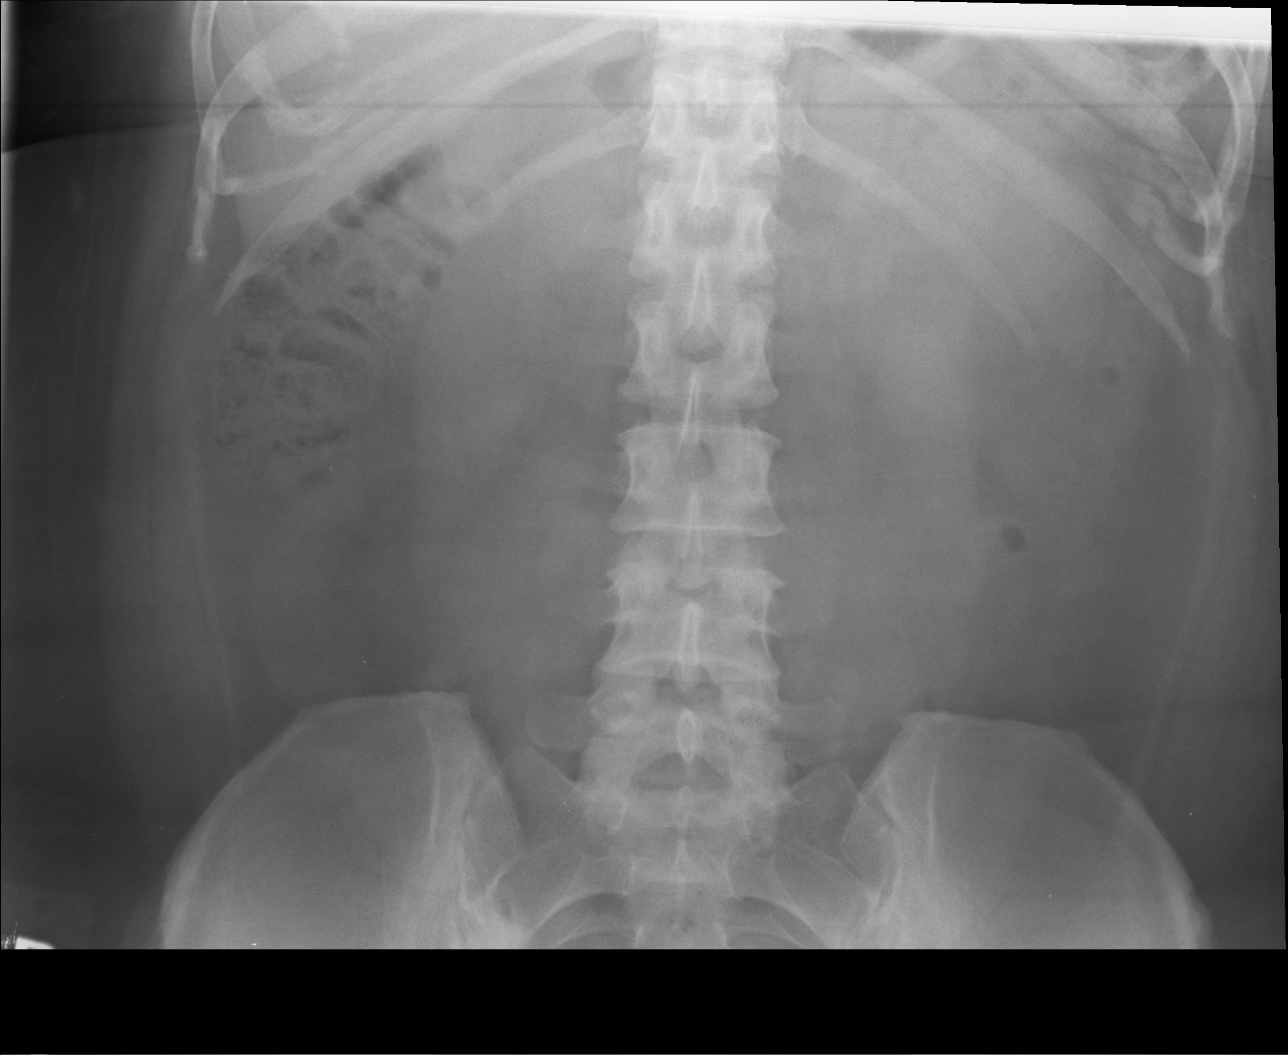

[AP (3 of 3)]
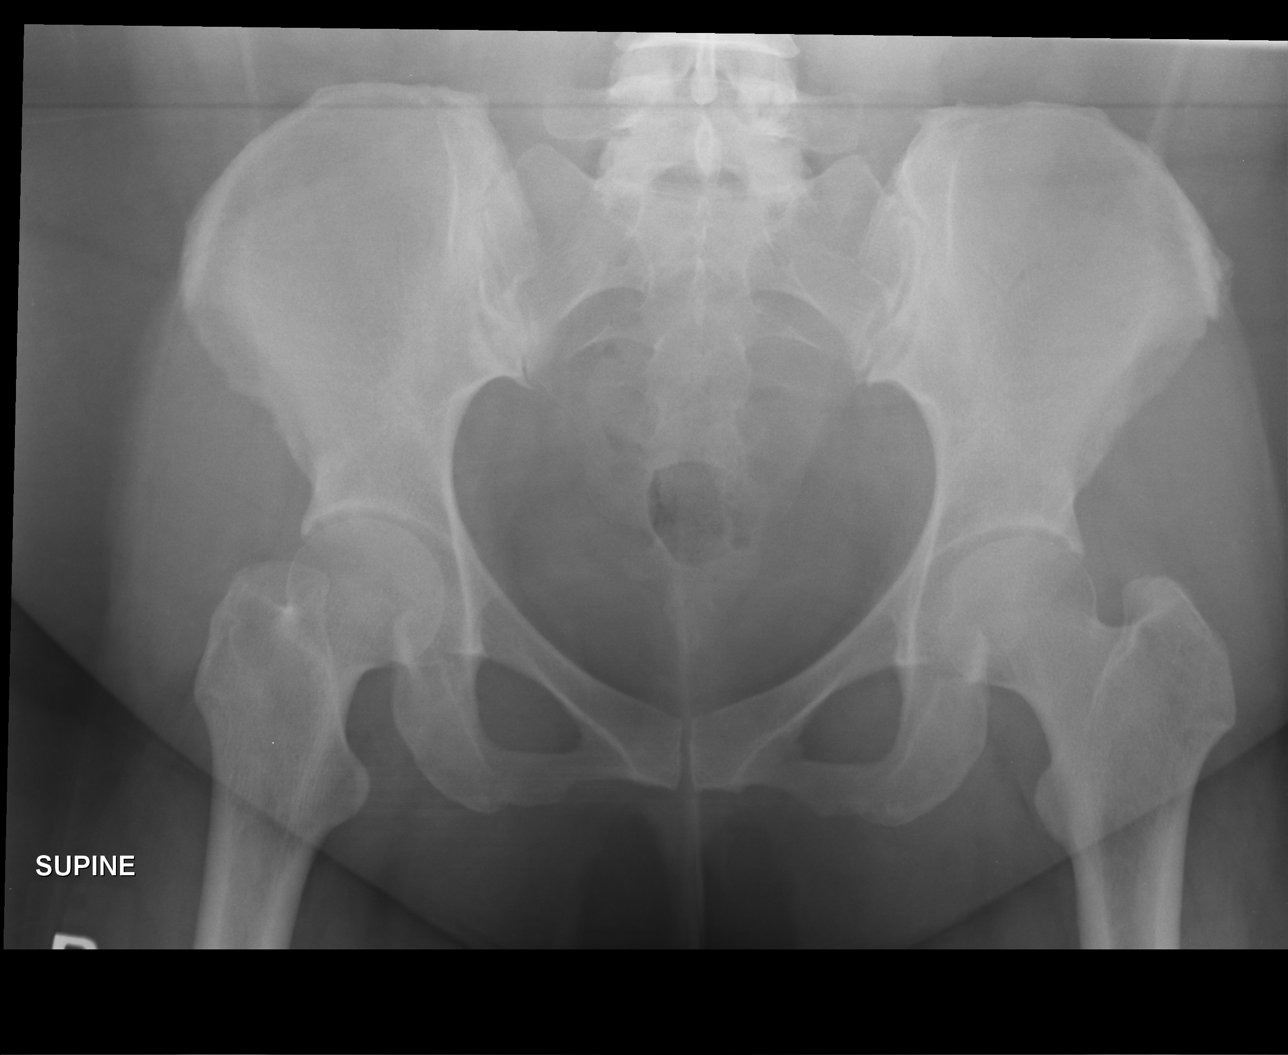

[4 of 4 positions shown; findings below may reference images not displayed]

FINDINGS: Chest:

Cardiomediastinal silhouette within normal limits.

No confluent airspace disease pneumothorax or pleural effusion.

No central vascular congestion.

No displaced fracture.

Abdomen:

Gas present within stomach, small bowel, colon. No abnormally
distended small bowel or colon.

No unexpected calcifications. No unexpected radiopaque foreign body.

No free air.

No displaced fracture.
IMPRESSION: Chest:

No radiographic evidence of a acute cardiopulmonary disease.

Abdomen:

Normal bowel gas pattern.

## 2016-12-10 ENCOUNTER — Ambulatory Visit (INDEPENDENT_AMBULATORY_CARE_PROVIDER_SITE_OTHER): Payer: 59 | Admitting: Internal Medicine

## 2016-12-10 ENCOUNTER — Encounter: Payer: Self-pay | Admitting: Internal Medicine

## 2016-12-10 VITALS — BP 132/84 | HR 76 | Temp 98.4°F | Ht 65.0 in | Wt 266.0 lb

## 2016-12-10 DIAGNOSIS — E119 Type 2 diabetes mellitus without complications: Secondary | ICD-10-CM | POA: Diagnosis not present

## 2016-12-10 DIAGNOSIS — Z23 Encounter for immunization: Secondary | ICD-10-CM | POA: Diagnosis not present

## 2016-12-10 MED ORDER — DULAGLUTIDE 1.5 MG/0.5ML ~~LOC~~ SOAJ: 1.5000 mg | SUBCUTANEOUS | 11 refills | Status: DC

## 2016-12-10 NOTE — Assessment & Plan Note (Signed)
Will change metformin to trulicity for more weight loss to help jump start her diet. Checking HgA1c today. Adjust as needed.

## 2016-12-10 NOTE — Patient Instructions (Signed)
We have sent in the medicine trulicity that is a once a week shot.   It will help to lower the weight and the sugars some. Stop taking the metformin once you start the shot.

## 2016-12-10 NOTE — Assessment & Plan Note (Signed)
Weight is up from last visit, diet changes. She is not exercising. 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

## 2016-12-10 NOTE — Progress Notes (Signed)
   Subjective:    Patient ID: Karen Peters, female    DOB: December 06, 1970, 46 y.o.   MRN: [id number]  HPI The patient is a 46 YO female coming in for follow up of her weight (increasing since last visit, more food lately with vacation, wants to lose weight and is not sure how to go about that, has tried diets in the past, does not want to have any surgery), and her diabetes (she is taking the metformin xr and this is better with her stomach, rare diarrhea with it, diet has been poor lately, on ACE-I).   Review of Systems  Constitutional: Positive for activity change, appetite change and unexpected weight change.  Respiratory: Negative.   Cardiovascular: Negative.   Gastrointestinal: Negative.   Musculoskeletal: Negative.   Skin: Negative.   Neurological: Negative.       Objective:   Physical Exam  Constitutional: She is oriented to person, place, and time. She appears well-developed and well-nourished.  Obese  HENT:  Head: Normocephalic and atraumatic.  Eyes: EOM are normal.  Neck: Normal range of motion.  Cardiovascular: Normal rate and regular rhythm.   Pulmonary/Chest: Effort normal and breath sounds normal.  Abdominal: Soft. She exhibits no distension. There is no tenderness. There is no rebound.  Musculoskeletal: She exhibits no edema.  Neurological: She is alert and oriented to person, place, and time.  Skin: Skin is warm and dry.   Vitals:   12/10/16 1006  BP: 132/84  Pulse: 76  Temp: 98.4 F (36.9 C)  TempSrc: Oral  SpO2: 99%  Weight: 266 lb (120.7 kg)  Height: 5\' 5"  (1.651 m)      Assessment & Plan:  Flu shot given at visit.

## 2016-12-18 ENCOUNTER — Ambulatory Visit: Payer: 59 | Admitting: Internal Medicine

## 2017-01-27 ENCOUNTER — Encounter: Payer: Self-pay | Admitting: Internal Medicine

## 2017-01-28 MED ORDER — DULAGLUTIDE 1.5 MG/0.5ML ~~LOC~~ SOAJ: 1.5000 mg | SUBCUTANEOUS | 3 refills | Status: DC

## 2017-03-04 ENCOUNTER — Encounter: Payer: Self-pay | Admitting: Internal Medicine

## 2017-03-04 MED ORDER — METFORMIN HCL ER 500 MG PO TB24: 500.0000 mg | ORAL_TABLET | Freq: Every day | ORAL | 3 refills | Status: DC

## 2017-03-16 DIAGNOSIS — G4733 Obstructive sleep apnea (adult) (pediatric): Secondary | ICD-10-CM | POA: Diagnosis not present

## 2017-03-23 DIAGNOSIS — G4733 Obstructive sleep apnea (adult) (pediatric): Secondary | ICD-10-CM | POA: Diagnosis not present

## 2017-06-09 ENCOUNTER — Encounter: Payer: 59 | Admitting: Internal Medicine

## 2017-06-13 ENCOUNTER — Encounter: Payer: Self-pay | Admitting: Internal Medicine

## 2017-06-13 ENCOUNTER — Other Ambulatory Visit (INDEPENDENT_AMBULATORY_CARE_PROVIDER_SITE_OTHER): Payer: 59

## 2017-06-13 ENCOUNTER — Ambulatory Visit (INDEPENDENT_AMBULATORY_CARE_PROVIDER_SITE_OTHER): Payer: 59 | Admitting: Internal Medicine

## 2017-06-13 VITALS — BP 126/82 | HR 89 | Temp 97.9°F | Ht 65.0 in | Wt 265.0 lb

## 2017-06-13 DIAGNOSIS — Z0001 Encounter for general adult medical examination with abnormal findings: Secondary | ICD-10-CM

## 2017-06-13 DIAGNOSIS — E119 Type 2 diabetes mellitus without complications: Secondary | ICD-10-CM

## 2017-06-13 DIAGNOSIS — Z0000 Encounter for general adult medical examination without abnormal findings: Secondary | ICD-10-CM

## 2017-06-13 DIAGNOSIS — I10 Essential (primary) hypertension: Secondary | ICD-10-CM

## 2017-06-13 LAB — LIPID PANEL
CHOL/HDL RATIO: 3
Cholesterol: 205 mg/dL — ABNORMAL HIGH (ref 0–200)
HDL: 58.8 mg/dL (ref >39.00)
LDL CALC: 123 mg/dL — AB (ref 0–99)
NONHDL: 146.65
Triglycerides: 118 mg/dL (ref 0.0–149.0)
VLDL: 23.6 mg/dL (ref 0.0–40.0)

## 2017-06-13 LAB — HEMOGLOBIN A1C: Hgb A1c MFr Bld: 6.7 % — ABNORMAL HIGH (ref 4.6–6.5)

## 2017-06-13 LAB — CBC
HCT: 40.1 % (ref 36.0–46.0)
Hemoglobin: 13.4 g/dL (ref 12.0–15.0)
MCHC: 33.6 g/dL (ref 30.0–36.0)
MCV: 83.6 fl (ref 78.0–100.0)
Platelets: 344 10*3/uL (ref 150.0–400.0)
RBC: 4.79 Mil/uL (ref 3.87–5.11)
RDW: 15 % (ref 11.5–15.5)
WBC: 7.9 10*3/uL (ref 4.0–10.5)

## 2017-06-13 LAB — COMPREHENSIVE METABOLIC PANEL
ALT: 47 U/L — AB (ref 0–35)
AST: 27 U/L (ref 0–37)
Albumin: 3.9 g/dL (ref 3.5–5.2)
Alkaline Phosphatase: 104 U/L (ref 39–117)
BILIRUBIN TOTAL: 0.4 mg/dL (ref 0.2–1.2)
BUN: 8 mg/dL (ref 6–23)
CHLORIDE: 98 meq/L (ref 96–112)
CO2: 29 meq/L (ref 19–32)
Calcium: 9.2 mg/dL (ref 8.4–10.5)
Creatinine, Ser: 0.71 mg/dL (ref 0.40–1.20)
GFR: 94.38 mL/min (ref >60.00)
GLUCOSE: 131 mg/dL — AB (ref 70–99)
POTASSIUM: 3.6 meq/L (ref 3.5–5.1)
Sodium: 136 mEq/L (ref 135–145)
Total Protein: 7.4 g/dL (ref 6.0–8.3)

## 2017-06-13 LAB — TSH: TSH: 4.99 u[IU]/mL — AB (ref 0.35–4.50)

## 2017-06-13 MED ORDER — DAPAGLIFLOZIN PROPANEDIOL 10 MG PO TABS: 10.0000 mg | ORAL_TABLET | Freq: Every day | ORAL | 6 refills | Status: DC

## 2017-06-13 NOTE — Assessment & Plan Note (Signed)
BP at goal on lisinopril/hctz 5/7.5 mg daily. Checking CMP and adjust as needed.

## 2017-06-13 NOTE — Assessment & Plan Note (Signed)
Has gyn and up to date on pap smear and mammogram. Tetanus and flu shot up to date. Declines pneumonia shot today. Counseled on exercise and diet management. Given screening recommendations.

## 2017-06-13 NOTE — Patient Instructions (Addendum)
We have sent in [place] to try taking. It is a pill 1 daily.   Health Maintenance, Female Adopting a healthy lifestyle and getting preventive care can go a long way to promote health and wellness. Talk with your health care provider about what schedule of regular examinations is right for you. This is a good chance for you to check in with your provider about disease prevention and staying healthy. In between checkups, there are plenty of things you can do on your own. Experts have done a lot of research about which lifestyle changes and preventive measures are most likely to keep you healthy. Ask your health care provider for more information. Weight and diet Eat a healthy diet  Be sure to include plenty of vegetables, fruits, low-fat dairy products, and lean protein.  Do not eat a lot of foods high in solid fats, added sugars, or salt.  Get regular exercise. This is one of the most important things you can do for your health. ? Most adults should exercise for at least 150 minutes each week. The exercise should increase your heart rate and make you sweat (moderate-intensity exercise). ? Most adults should also do strengthening exercises at least twice a week. This is in addition to the moderate-intensity exercise.  Maintain a healthy weight  Body mass index (BMI) is a measurement that can be used to identify possible weight problems. It estimates body fat based on height and weight. Your health care provider can help determine your BMI and help you achieve or maintain a healthy weight.  For females 81 years of age and older: ? A BMI below 18.5 is considered underweight. ? A BMI of 18.5 to 24.9 is normal. ? A BMI of 25 to 29.9 is considered overweight. ? A BMI of 30 and above is considered obese.  Watch levels of cholesterol and blood lipids  You should start having your blood tested for lipids and cholesterol at 46 years of age, then have this test every 5 years.  You may need to have  your cholesterol levels checked more often if: ? Your lipid or cholesterol levels are high. ? You are older than 46 years of age. ? You are at high risk for heart disease.  Cancer screening Lung Cancer  Lung cancer screening is recommended for adults 4-34 years old who are at high risk for lung cancer because of a history of smoking.  A yearly low-dose CT scan of the lungs is recommended for people who: ? Currently smoke. ? Have quit within the past 15 years. ? Have at least a 30-pack-year history of smoking. A pack year is smoking an average of one pack of cigarettes a day for 1 year.  Yearly screening should continue until it has been 15 years since you quit.  Yearly screening should stop if you develop a health problem that would prevent you from having lung cancer treatment.  Breast Cancer  Practice breast self-awareness. This means understanding how your breasts normally appear and feel.  It also means doing regular breast self-exams. Let your health care provider know about any changes, no matter how small.  If you are in your 20s or 30s, you should have a clinical breast exam (CBE) by a health care provider every 1-3 years as part of a regular health exam.  If you are 97 or older, have a CBE every year. Also consider having a breast X-ray (mammogram) every year.  If you have a family history of breast cancer,  talk to your health care provider about genetic screening.  If you are at high risk for breast cancer, talk to your health care provider about having an MRI and a mammogram every year.  Breast cancer gene (BRCA) assessment is recommended for women who have family members with BRCA-related cancers. BRCA-related cancers include: ? Breast. ? Ovarian. ? Tubal. ? Peritoneal cancers.  Results of the assessment will determine the need for genetic counseling and BRCA1 and BRCA2 testing.  Cervical Cancer Your health care provider may recommend that you be screened  regularly for cancer of the pelvic organs (ovaries, uterus, and vagina). This screening involves a pelvic examination, including checking for microscopic changes to the surface of your cervix (Pap test). You may be encouraged to have this screening done every 3 years, beginning at age 27.  For women ages 48-65, health care providers may recommend pelvic exams and Pap testing every 3 years, or they may recommend the Pap and pelvic exam, combined with testing for human papilloma virus (HPV), every 5 years. Some types of HPV increase your risk of cervical cancer. Testing for HPV may also be done on women of any age with unclear Pap test results.  Other health care providers may not recommend any screening for nonpregnant women who are considered low risk for pelvic cancer and who do not have symptoms. Ask your health care provider if a screening pelvic exam is right for you.  If you have had past treatment for cervical cancer or a condition that could lead to cancer, you need Pap tests and screening for cancer for at least 20 years after your treatment. If Pap tests have been discontinued, your risk factors (such as having a new sexual partner) need to be reassessed to determine if screening should resume. Some women have medical problems that increase the chance of getting cervical cancer. In these cases, your health care provider may recommend more frequent screening and Pap tests.  Colorectal Cancer  This type of cancer can be detected and often prevented.  Routine colorectal cancer screening usually begins at 46 years of age and continues through 46 years of age.  Your health care provider may recommend screening at an earlier age if you have risk factors for colon cancer.  Your health care provider may also recommend using home test kits to check for hidden blood in the stool.  A small camera at the end of a tube can be used to examine your colon directly (sigmoidoscopy or colonoscopy). This is  done to check for the earliest forms of colorectal cancer.  Routine screening usually begins at age 74.  Direct examination of the colon should be repeated every 5-10 years through 46 years of age. However, you may need to be screened more often if early forms of precancerous polyps or small growths are found.  Skin Cancer  Check your skin from head to toe regularly.  Tell your health care provider about any new moles or changes in moles, especially if there is a change in a mole's shape or color.  Also tell your health care provider if you have a mole that is larger than the size of a pencil eraser.  Always use sunscreen. Apply sunscreen liberally and repeatedly throughout the day.  Protect yourself by wearing long sleeves, pants, a wide-brimmed hat, and sunglasses whenever you are outside.  Heart disease, diabetes, and high blood pressure  High blood pressure causes heart disease and increases the risk of stroke. High blood pressure is  more likely to develop in: ? People who have blood pressure in the high end of the normal range (130-139/85-89 mm Hg). ? People who are overweight or obese. ? People who are African American.  If you are 90-6 years of age, have your blood pressure checked every 3-5 years. If you are 13 years of age or older, have your blood pressure checked every year. You should have your blood pressure measured twice-once when you are at a hospital or clinic, and once when you are not at a hospital or clinic. Record the average of the two measurements. To check your blood pressure when you are not at a hospital or clinic, you can use: ? An automated blood pressure machine at a pharmacy. ? A home blood pressure monitor.  If you are between 68 years and 32 years old, ask your health care provider if you should take aspirin to prevent strokes.  Have regular diabetes screenings. This involves taking a blood sample to check your fasting blood sugar level. ? If you are  at a normal weight and have a low risk for diabetes, have this test once every three years after 46 years of age. ? If you are overweight and have a high risk for diabetes, consider being tested at a younger age or more often. Preventing infection Hepatitis B  If you have a higher risk for hepatitis B, you should be screened for this virus. You are considered at high risk for hepatitis B if: ? You were born in a country where hepatitis B is common. Ask your health care provider which countries are considered high risk. ? Your parents were born in a high-risk country, and you have not been immunized against hepatitis B (hepatitis B vaccine). ? You have HIV or AIDS. ? You use needles to inject street drugs. ? You live with someone who has hepatitis B. ? You have had sex with someone who has hepatitis B. ? You get hemodialysis treatment. ? You take certain medicines for conditions, including cancer, organ transplantation, and autoimmune conditions.  Hepatitis C  Blood testing is recommended for: ? Everyone born from [birth year] through 1964. ? Anyone with known risk factors for hepatitis C.  Sexually transmitted infections (STIs)  You should be screened for sexually transmitted infections (STIs) including gonorrhea and chlamydia if: ? You are sexually active and are younger than 46 years of age. ? You are older than 46 years of age and your health care provider tells you that you are at risk for this type of infection. ? Your sexual activity has changed since you were last screened and you are at an increased risk for chlamydia or gonorrhea. Ask your health care provider if you are at risk.  If you do not have HIV, but are at risk, it may be recommended that you take a prescription medicine daily to prevent HIV infection. This is called pre-exposure prophylaxis (PrEP). You are considered at risk if: ? You are sexually active and do not regularly use condoms or know the HIV status of your  partner(s). ? You take drugs by injection. ? You are sexually active with a partner who has HIV.  Talk with your health care provider about whether you are at high risk of being infected with HIV. If you choose to begin PrEP, you should first be tested for HIV. You should then be tested every 3 months for as long as you are taking PrEP. Pregnancy  If you are premenopausal and you  may become pregnant, ask your health care provider about preconception counseling.  If you may become pregnant, take 400 to 800 micrograms (mcg) of folic acid every day.  If you want to prevent pregnancy, talk to your health care provider about birth control (contraception). Osteoporosis and menopause  Osteoporosis is a disease in which the bones lose minerals and strength with aging. This can result in serious bone fractures. Your risk for osteoporosis can be identified using a bone density scan.  If you are 59 years of age or older, or if you are at risk for osteoporosis and fractures, ask your health care provider if you should be screened.  Ask your health care provider whether you should take a calcium or vitamin D supplement to lower your risk for osteoporosis.  Menopause may have certain physical symptoms and risks.  Hormone replacement therapy may reduce some of these symptoms and risks. Talk to your health care provider about whether hormone replacement therapy is right for you. Follow these instructions at home:  Schedule regular health, dental, and eye exams.  Stay current with your immunizations.  Do not use any tobacco products including cigarettes, chewing tobacco, or electronic cigarettes.  If you are pregnant, do not drink alcohol.  If you are breastfeeding, limit how much and how often you drink alcohol.  Limit alcohol intake to no more than 1 drink per day for nonpregnant women. One drink equals 12 ounces of beer, 5 ounces of wine, or 1 ounces of hard liquor.  Do not use street  drugs.  Do not share needles.  Ask your health care provider for help if you need support or information about quitting drugs.  Tell your health care provider if you often feel depressed.  Tell your health care provider if you have ever been abused or do not feel safe at home. This information is not intended to replace advice given to you by your health care provider. Make sure you discuss any questions you have with your health care provider. Document Released: 10/01/2010 Document Revised: 08/24/2015 Document Reviewed: 12/20/2014 Elsevier Interactive Patient Education  [employer].

## 2017-06-13 NOTE — Progress Notes (Signed)
   Subjective:    Patient ID: Karen Peters, female    DOB: Sep 06, 1970, 47 y.o.   MRN: [id number]  HPI The patient is a 47 YO female coming in for physical. No new concerns. Some weight gain.   [facility] reviewed and updated.   Review of Systems  Constitutional: Positive for appetite change and unexpected weight change.  HENT: Negative.   Eyes: Negative.   Respiratory: Negative for cough, chest tightness and shortness of breath.   Cardiovascular: Negative for chest pain, palpitations and leg swelling.  Gastrointestinal: Negative for abdominal distention, abdominal pain, constipation, diarrhea, nausea and vomiting.  Musculoskeletal: Negative.   Skin: Negative.   Neurological: Negative.   Psychiatric/Behavioral: Negative.       Objective:   Physical Exam  Constitutional: She is oriented to person, place, and time. She appears well-developed and well-nourished.  Overweight  HENT:  Head: Normocephalic and atraumatic.  Eyes: EOM are normal.  Neck: Normal range of motion.  Cardiovascular: Normal rate and regular rhythm.  Pulmonary/Chest: Effort normal and breath sounds normal. No respiratory distress. She has no wheezes. She has no rales.  Abdominal: Soft. Bowel sounds are normal. She exhibits no distension. There is no tenderness. There is no rebound.  Musculoskeletal: She exhibits no edema.  Neurological: She is alert and oriented to person, place, and time. Coordination normal.  Skin: Skin is warm and dry.  Foot exam done  Psychiatric: She has a normal mood and affect.   Vitals:   06/13/17 0826  BP: 126/82  Pulse: 89  Temp: 97.9 F (36.6 C)  TempSrc: Oral  SpO2: 96%  Weight: 265 lb (120.2 kg)  Height: 5\' 5"  (1.651 m)      Assessment & Plan:

## 2017-06-13 NOTE — Assessment & Plan Note (Signed)
Add farxiga for weight loss and depending on HgA1c stop metformin. She is still having some diarrhea with it although better with xr form. Not complicated. Foot exam done. Reminded about yearly eye exam.

## 2017-06-13 NOTE — Assessment & Plan Note (Signed)
Weight is increasing due to stress due to work and school and overeating. She is not exercising and wants to work on these things.

## 2017-08-03 ENCOUNTER — Other Ambulatory Visit: Payer: Self-pay | Admitting: Internal Medicine

## 2017-09-16 ENCOUNTER — Encounter: Payer: Self-pay | Admitting: Internal Medicine

## 2017-09-16 ENCOUNTER — Other Ambulatory Visit (INDEPENDENT_AMBULATORY_CARE_PROVIDER_SITE_OTHER): Payer: 59

## 2017-09-16 ENCOUNTER — Ambulatory Visit (INDEPENDENT_AMBULATORY_CARE_PROVIDER_SITE_OTHER): Payer: 59 | Admitting: Internal Medicine

## 2017-09-16 VITALS — BP 128/90 | HR 83 | Temp 98.2°F | Ht 65.0 in | Wt 260.0 lb

## 2017-09-16 DIAGNOSIS — E119 Type 2 diabetes mellitus without complications: Secondary | ICD-10-CM

## 2017-09-16 LAB — HEMOGLOBIN A1C: Hgb A1c MFr Bld: 6.5 % (ref 4.6–6.5)

## 2017-09-16 LAB — VITAMIN B12: Vitamin B-12: 454 pg/mL (ref 211–911)

## 2017-09-16 NOTE — Patient Instructions (Signed)
We will check the labs today. 

## 2017-09-16 NOTE — Progress Notes (Signed)
   Subjective:    Patient ID: Karen Peters, female    DOB: 25-Feb-1971, 47 y.o.   MRN: [id number]  HPI The patient is a 47 YO female coming in for farxiga initiation. She is using this for sugar control as well as weight loss. She is down about 5-6 pounds since last visit. She is surprised about this as she has not been able to make changes to diet or exercise given her demanding school and work schedule. She is done with school now and feels like she will be able to continue with making changes and working on better health. She denies side effects to this. She is taking metformin as well as the farxiga. Would like to continue. Denies excessive urination, uti, yeast infection, dizziness. Denies new numbness or weakness.   Review of Systems  Constitutional: Negative.   HENT: Negative.   Eyes: Negative.   Respiratory: Negative for cough, chest tightness and shortness of breath.   Cardiovascular: Negative for chest pain, palpitations and leg swelling.  Gastrointestinal: Negative for abdominal distention, abdominal pain, constipation, diarrhea, nausea and vomiting.  Musculoskeletal: Negative.   Skin: Negative.   Neurological: Negative.   Psychiatric/Behavioral: Negative.       Objective:   Physical Exam  Constitutional: She is oriented to person, place, and time. She appears well-developed and well-nourished.  overweight  HENT:  Head: Normocephalic and atraumatic.  Eyes: EOM are normal.  Neck: Normal range of motion.  Cardiovascular: Normal rate and regular rhythm.  Pulmonary/Chest: Effort normal and breath sounds normal. No respiratory distress. She has no wheezes. She has no rales.  Abdominal: Soft. Bowel sounds are normal. She exhibits no distension. There is no tenderness. There is no rebound.  Musculoskeletal: She exhibits no edema.  Neurological: She is alert and oriented to person, place, and time. Coordination normal.  Skin: Skin is warm and dry.   Vitals:   09/16/17 0900  09/16/17 0904  BP:  128/90  Pulse:  83  Temp:  98.2 F (36.8 C)  TempSrc:  Oral  SpO2:  95%  Weight: 260 lb (117.9 kg) 260 lb (117.9 kg)  Height: 5\' 5"  (1.651 m) 5\' 5"  (1.651 m)      Assessment & Plan:

## 2017-09-19 NOTE — Assessment & Plan Note (Signed)
Weight is down about 5-6 pounds. She has not been able to make any lifestyle changes since last time. She started taking farxiga since last visit. She will continue on farxiga. Now that she is done with school she is committed to making some healthy changes with both diet and exercise. Her goal is to lose 10 pounds before our visit in 3 months. Long term goal is to be in 100s for weight.

## 2017-09-19 NOTE — Assessment & Plan Note (Signed)
Checking HgA1c today and adjust as needed. She will continue metformin and farxiga with the goal of weight loss and sugar control. She is down about 5-6 pounds since starting farxiga without side effects.

## 2017-09-23 DIAGNOSIS — G4733 Obstructive sleep apnea (adult) (pediatric): Secondary | ICD-10-CM | POA: Diagnosis not present

## 2017-09-24 ENCOUNTER — Encounter: Payer: Self-pay | Admitting: Internal Medicine

## 2017-10-01 ENCOUNTER — Other Ambulatory Visit: Payer: Self-pay | Admitting: Family

## 2017-10-08 ENCOUNTER — Other Ambulatory Visit: Payer: Self-pay

## 2017-10-08 MED ORDER — LISINOPRIL-HYDROCHLOROTHIAZIDE 10-12.5 MG PO TABS: 0.5000 | ORAL_TABLET | Freq: Every day | ORAL | 2 refills | Status: DC

## 2017-10-12 ENCOUNTER — Encounter: Payer: Self-pay | Admitting: Internal Medicine

## 2017-10-14 MED ORDER — CLOBETASOL PROPIONATE 0.05 % EX SOLN: 1.0000 "application " | Freq: Two times a day (BID) | CUTANEOUS | 0 refills | Status: DC

## 2017-10-23 ENCOUNTER — Other Ambulatory Visit: Payer: Self-pay | Admitting: Family

## 2017-11-03 ENCOUNTER — Other Ambulatory Visit: Payer: Self-pay

## 2017-11-03 MED ORDER — LEVOCETIRIZINE DIHYDROCHLORIDE 5 MG PO TABS: 5.0000 mg | ORAL_TABLET | Freq: Every evening | ORAL | 3 refills | Status: DC

## 2017-11-12 ENCOUNTER — Other Ambulatory Visit: Payer: Self-pay | Admitting: Internal Medicine

## 2017-11-19 ENCOUNTER — Encounter: Payer: Self-pay | Admitting: Internal Medicine

## 2017-11-19 ENCOUNTER — Other Ambulatory Visit: Payer: Self-pay | Admitting: Internal Medicine

## 2018-03-23 ENCOUNTER — Other Ambulatory Visit: Payer: Self-pay | Admitting: Internal Medicine

## 2018-05-28 ENCOUNTER — Other Ambulatory Visit: Payer: Self-pay | Admitting: Internal Medicine

## 2018-06-03 ENCOUNTER — Ambulatory Visit (INDEPENDENT_AMBULATORY_CARE_PROVIDER_SITE_OTHER): Payer: 59 | Admitting: Internal Medicine

## 2018-06-03 ENCOUNTER — Encounter: Payer: Self-pay | Admitting: Internal Medicine

## 2018-06-03 ENCOUNTER — Other Ambulatory Visit (INDEPENDENT_AMBULATORY_CARE_PROVIDER_SITE_OTHER): Payer: 59

## 2018-06-03 VITALS — BP 126/88 | HR 95 | Temp 97.9°F | Ht 65.0 in | Wt 239.0 lb

## 2018-06-03 DIAGNOSIS — R51 Headache: Secondary | ICD-10-CM

## 2018-06-03 DIAGNOSIS — G4452 New daily persistent headache (NDPH): Secondary | ICD-10-CM | POA: Diagnosis not present

## 2018-06-03 DIAGNOSIS — E785 Hyperlipidemia, unspecified: Secondary | ICD-10-CM | POA: Diagnosis not present

## 2018-06-03 DIAGNOSIS — E119 Type 2 diabetes mellitus without complications: Secondary | ICD-10-CM | POA: Diagnosis not present

## 2018-06-03 DIAGNOSIS — E1169 Type 2 diabetes mellitus with other specified complication: Secondary | ICD-10-CM

## 2018-06-03 DIAGNOSIS — R519 Headache, unspecified: Secondary | ICD-10-CM | POA: Insufficient documentation

## 2018-06-03 LAB — COMPREHENSIVE METABOLIC PANEL
ALK PHOS: 129 U/L — AB (ref 39–117)
ALT: 42 U/L — AB (ref 0–35)
AST: 21 U/L (ref 0–37)
Albumin: 3.9 g/dL (ref 3.5–5.2)
BUN: 12 mg/dL (ref 6–23)
CALCIUM: 8.7 mg/dL (ref 8.4–10.5)
CO2: 27 meq/L (ref 19–32)
Chloride: 100 mEq/L (ref 96–112)
Creatinine, Ser: 0.69 mg/dL (ref 0.40–1.20)
GFR: 91.39 mL/min (ref >60.00)
GLUCOSE: 111 mg/dL — AB (ref 70–99)
Potassium: 3.7 mEq/L (ref 3.5–5.1)
Sodium: 135 mEq/L (ref 135–145)
TOTAL PROTEIN: 7.4 g/dL (ref 6.0–8.3)
Total Bilirubin: 0.4 mg/dL (ref 0.2–1.2)

## 2018-06-03 LAB — HEMOGLOBIN A1C: Hgb A1c MFr Bld: 6 % (ref 4.6–6.5)

## 2018-06-03 LAB — CBC
HCT: 39 % (ref 36.0–46.0)
HEMOGLOBIN: 13.2 g/dL (ref 12.0–15.0)
MCHC: 33.9 g/dL (ref 30.0–36.0)
MCV: 80 fl (ref 78.0–100.0)
PLATELETS: 429 10*3/uL — AB (ref 150.0–400.0)
RBC: 4.88 Mil/uL (ref 3.87–5.11)
RDW: 16.1 % — AB (ref 11.5–15.5)
WBC: 6.4 10*3/uL (ref 4.0–10.5)

## 2018-06-03 LAB — LIPID PANEL
CHOL/HDL RATIO: 4
Cholesterol: 219 mg/dL — ABNORMAL HIGH (ref 0–200)
HDL: 57.8 mg/dL (ref >39.00)
LDL Cholesterol: 139 mg/dL — ABNORMAL HIGH (ref 0–99)
NONHDL: 160.81
Triglycerides: 108 mg/dL (ref 0.0–149.0)
VLDL: 21.6 mg/dL (ref 0.0–40.0)

## 2018-06-03 NOTE — Progress Notes (Signed)
   Subjective:   Patient ID: Karen Peters, female    DOB: July 15, 1970, 48 y.o.   MRN: [id number]  HPI The patient is a 48 YO female coming in for some new concerns  Of morning headache/jitteriness (concerned about low sugars from weight loss, down 30 pounds in the last 6 months, stopped sugary beverages and fasting after 7pm daily, denies new stressors, is not exercising lately due to some equipment in storage) as well as follow up of her cholesterol (she has lost 30 pounds in the last 6 months to help, decided to do that prior to taking cholesterol medication, denies stroke or heart attack symptoms) and sugars (has lost 30 pounds in the last 6 months, denies new numbness or weakness, still taking metformin and farxiga, denies high sugars, unclear if she is having low sugars, some jitteriness in the mornings).   Review of Systems  Constitutional: Negative.   HENT: Negative.   Eyes: Negative.   Respiratory: Negative for cough, chest tightness and shortness of breath.   Cardiovascular: Negative for chest pain, palpitations and leg swelling.  Gastrointestinal: Negative for abdominal distention, abdominal pain, constipation, diarrhea, nausea and vomiting.  Musculoskeletal: Negative.   Skin: Negative.   Neurological: Positive for headaches.  Psychiatric/Behavioral: Negative.        Jitteriness in the morning    Objective:  Physical Exam Constitutional:      Appearance: She is well-developed. She is obese.  HENT:     Head: Normocephalic and atraumatic.  Neck:     Musculoskeletal: Normal range of motion.  Cardiovascular:     Rate and Rhythm: Normal rate and regular rhythm.  Pulmonary:     Effort: Pulmonary effort is normal. No respiratory distress.     Breath sounds: Normal breath sounds. No wheezing or rales.  Abdominal:     General: Bowel sounds are normal. There is no distension.     Palpations: Abdomen is soft.     Tenderness: There is no abdominal tenderness. There is no rebound.    Skin:    General: Skin is warm and dry.  Neurological:     Mental Status: She is alert and oriented to person, place, and time.     Coordination: Coordination normal.     Vitals:   06/03/18 0922  BP: 126/88  Pulse: 95  Temp: 97.9 F (36.6 C)  TempSrc: Oral  SpO2: 95%  Weight: 239 lb (108.4 kg)  Height: 5\' 5"  (1.651 m)    Assessment & Plan:

## 2018-06-03 NOTE — Patient Instructions (Signed)
We will check the sugars today. We will plan for you to stop the farxiga when you run out.

## 2018-06-03 NOTE — Assessment & Plan Note (Signed)
LDL was above goal last time and she committed to lifestyle changes. Is down 30 pounds and rechecking lipid panel today. Adjust as needed.

## 2018-06-03 NOTE — Assessment & Plan Note (Signed)
Morning headache and given intermittent fasting concern for low sugar readings in the morning. Checking HgA1c today and adjust as needed. Will plan to stop farixga when she runs out. If still persistent could do sleep study for change in OSA

## 2018-06-03 NOTE — Assessment & Plan Note (Signed)
Concern for low sugars with weight loss. Will check HgA1c and plan to stop farxiga when she runs out and continue metformin. She is on ACE-I and checking cholesterol.

## 2018-07-01 ENCOUNTER — Other Ambulatory Visit: Payer: Self-pay | Admitting: Internal Medicine

## 2018-07-10 ENCOUNTER — Encounter: Payer: Self-pay | Admitting: Internal Medicine

## 2018-07-13 MED ORDER — LISINOPRIL-HYDROCHLOROTHIAZIDE 20-12.5 MG PO TABS: 0.5000 | ORAL_TABLET | Freq: Every day | ORAL | 0 refills | Status: DC

## 2018-07-13 NOTE — Telephone Encounter (Signed)
Can you call her pharmacy and ask if they are able to get any dose combination of lisinopril/hctz that has 12.5 mg hctz in it? She only takes 1/2 pill daily of this and there is not another option to do 1/2 of a 12.5 mg hctz as the only formulation of hctz which is 12.5 mg is a capsule which cannot be split.

## 2018-08-02 ENCOUNTER — Other Ambulatory Visit: Payer: Self-pay | Admitting: Internal Medicine

## 2018-08-22 ENCOUNTER — Encounter: Payer: Self-pay | Admitting: Internal Medicine

## 2018-08-25 MED ORDER — SIMVASTATIN 20 MG PO TABS: 20.0000 mg | ORAL_TABLET | Freq: Every day | ORAL | 3 refills | Status: DC

## 2018-08-25 MED ORDER — DAPAGLIFLOZIN PROPANEDIOL 10 MG PO TABS: 10.0000 mg | ORAL_TABLET | Freq: Every day | ORAL | 3 refills | Status: DC

## 2018-09-09 ENCOUNTER — Other Ambulatory Visit: Payer: Self-pay | Admitting: Internal Medicine

## 2018-10-21 ENCOUNTER — Encounter: Payer: Self-pay | Admitting: Internal Medicine

## 2018-10-22 ENCOUNTER — Ambulatory Visit (INDEPENDENT_AMBULATORY_CARE_PROVIDER_SITE_OTHER): Payer: 59 | Admitting: Internal Medicine

## 2018-10-22 ENCOUNTER — Encounter: Payer: Self-pay | Admitting: Internal Medicine

## 2018-10-22 ENCOUNTER — Other Ambulatory Visit: Payer: Self-pay

## 2018-10-22 DIAGNOSIS — M791 Myalgia, unspecified site: Secondary | ICD-10-CM

## 2018-10-22 DIAGNOSIS — R0982 Postnasal drip: Secondary | ICD-10-CM

## 2018-10-22 DIAGNOSIS — R5383 Other fatigue: Secondary | ICD-10-CM

## 2018-10-22 NOTE — Progress Notes (Signed)
Virtual Visit via Video Note  I connected with Karen Peters on 10/22/18 at 10:45 AM EDT by a video enabled telemedicine application and verified that I am speaking with the correct person using two identifiers.   I discussed the limitations of evaluation and management by telemedicine and the availability of in person appointments. The patient expressed understanding and agreed to proceed.  The patient is currently at home and I am in the office.    No referring provider.    History of Present Illness: This is an acute visit for PND and muscle pain.  Started on statin about 6 weeks ago.  About 1 week ago she started experiencing generalized muscle aches and calf soreness.  She typically only feels this in the middle of the night-almost the morning when she gets up to goes to the bathroom.  She does notice it during the day but it is barely noticeable.  It became more noticeable in the past few days.  She was unsure if this was related to the statin or her other symptoms.  PND, malaise: She does have chronic allergies and takes Xyzal daily.  She uses Flonase as needed.  Her postnasal drip has been worse.  She also feels more fatigued than usual.  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  She denies any specific cold symptoms-she just feels like she is coming down with something.  She has not had any fevers.  She denies coughing, wheezing and shortness of breath. She denies sore throat, congestion or sinus pain.  She denies any exposure to COVID and has been careful.   Review of Systems  Constitutional: Positive for malaise/fatigue. Negative for fever.  HENT: Negative for congestion, ear pain, sinus pain and sore throat.        PND - chronic - worse than usual  Respiratory: Negative for cough, shortness of breath and wheezing.   Gastrointestinal: Positive for diarrhea (has that intermittently).   Musculoskeletal: Positive for myalgias.  Neurological: Negative for headaches.      Social History   Socioeconomic History  . Marital status: Single    Spouse name: Not on file  . Number of children: Not on file  . Years of education: Not on file  . Highest education level: Not on file  Occupational History  . Not on file  Social Needs  . Financial resource strain: Not on file  . Food insecurity    Worry: Not on file    Inability: Not on file  . Transportation needs    Medical: Not on file    Non-medical: Not on file  Tobacco Use  . Smoking status: Former Games developermoker  . Smokeless tobacco: Never Used  Substance and Sexual Activity  . Alcohol use: Yes    Alcohol/week: 0.0 standard drinks  . Drug use: No  . Sexual activity: Not on file  Lifestyle  . Physical activity    Days per week: Not on file    Minutes per session: Not on file  . Stress: Not on file  Relationships  . Social Musicianconnections    Talks on phone: Not on file    Gets together: Not on file    Attends religious service: Not on file    Active member of club or organization: Not on file    Attends meetings of clubs or organizations: Not on file    Relationship status: Not on file  Other Topics Concern  . Not on file  Social History Narrative  . Not on file     Observations/Objective: Appears well in NAD   Assessment and Plan:  See Problem List for Assessment and Plan of chronic medical problems.   Follow Up Instructions:    I discussed the assessment and treatment plan with the patient. The patient was provided an opportunity to ask questions and all were answered. The patient agreed with the plan and demonstrated an understanding of the instructions.   The patient was advised to call back or seek an in-person evaluation if the symptoms worsen or if the condition fails to improve as anticipated.    Binnie Rail, MD

## 2018-10-22 NOTE — Assessment & Plan Note (Signed)
Having worse postnasal drip than usual She does have chronic allergies and is taking Xyzal daily She uses Flonase as needed and she will start that daily for now She feels like she is coming down with something, but no other concerning symptoms Treat symptomatically at this time

## 2018-10-22 NOTE — Assessment & Plan Note (Signed)
Having muscle aches over the past week-worse in the past few days More noticeable at night and barely noticeable during the day Concern whether this was related to the statin that she started 6 weeks ago or if she was coming down with a cold Discussed that it is difficult to know what the causes Monitor for now-if persists can discontinue the statin for a couple of weeks to see if it resolves Call if no improvement

## 2018-10-22 NOTE — Assessment & Plan Note (Signed)
Having increased fatigue Likely related to other symptoms Possibly coming down with a cold or related to allergies At this point no treatment is needed-just monitor She is low risk for COVID-no known exposure If symptoms worsen or persist she will call-would consider COVID testing at that point

## 2018-10-23 ENCOUNTER — Encounter: Payer: Self-pay | Admitting: Internal Medicine

## 2018-10-23 ENCOUNTER — Other Ambulatory Visit: Payer: Self-pay

## 2018-10-23 DIAGNOSIS — R509 Fever, unspecified: Secondary | ICD-10-CM

## 2018-10-23 DIAGNOSIS — R52 Pain, unspecified: Secondary | ICD-10-CM

## 2018-10-23 DIAGNOSIS — R5383 Other fatigue: Secondary | ICD-10-CM

## 2018-10-23 DIAGNOSIS — Z20822 Contact with and (suspected) exposure to covid-19: Secondary | ICD-10-CM

## 2018-10-23 NOTE — Telephone Encounter (Signed)
Patient given information to [facility] site.

## 2018-10-27 ENCOUNTER — Encounter: Payer: Self-pay | Admitting: Internal Medicine

## 2018-10-27 LAB — NOVEL CORONAVIRUS, NAA: SARS-CoV-2, NAA: NOT DETECTED

## 2018-11-02 ENCOUNTER — Other Ambulatory Visit: Payer: Self-pay | Admitting: Internal Medicine

## 2018-12-08 ENCOUNTER — Encounter: Payer: 59 | Admitting: Internal Medicine

## 2018-12-16 ENCOUNTER — Other Ambulatory Visit (INDEPENDENT_AMBULATORY_CARE_PROVIDER_SITE_OTHER): Payer: 59

## 2018-12-16 ENCOUNTER — Encounter: Payer: Self-pay | Admitting: Internal Medicine

## 2018-12-16 ENCOUNTER — Ambulatory Visit (INDEPENDENT_AMBULATORY_CARE_PROVIDER_SITE_OTHER): Payer: 59 | Admitting: Internal Medicine

## 2018-12-16 ENCOUNTER — Other Ambulatory Visit: Payer: Self-pay

## 2018-12-16 VITALS — BP 120/84 | HR 83 | Temp 98.6°F | Ht 65.0 in | Wt 256.0 lb

## 2018-12-16 DIAGNOSIS — Z0000 Encounter for general adult medical examination without abnormal findings: Secondary | ICD-10-CM

## 2018-12-16 DIAGNOSIS — I10 Essential (primary) hypertension: Secondary | ICD-10-CM

## 2018-12-16 DIAGNOSIS — E1169 Type 2 diabetes mellitus with other specified complication: Secondary | ICD-10-CM

## 2018-12-16 DIAGNOSIS — E119 Type 2 diabetes mellitus without complications: Secondary | ICD-10-CM | POA: Diagnosis not present

## 2018-12-16 DIAGNOSIS — E785 Hyperlipidemia, unspecified: Secondary | ICD-10-CM

## 2018-12-16 DIAGNOSIS — Z23 Encounter for immunization: Secondary | ICD-10-CM

## 2018-12-16 LAB — T4, FREE: Free T4: 0.82 ng/dL (ref 0.60–1.60)

## 2018-12-16 LAB — COMPREHENSIVE METABOLIC PANEL
ALT: 39 U/L — ABNORMAL HIGH (ref 0–35)
AST: 23 U/L (ref 0–37)
Albumin: 3.9 g/dL (ref 3.5–5.2)
Alkaline Phosphatase: 126 U/L — ABNORMAL HIGH (ref 39–117)
BUN: 10 mg/dL (ref 6–23)
CO2: 27 mEq/L (ref 19–32)
Calcium: 9.1 mg/dL (ref 8.4–10.5)
Chloride: 99 mEq/L (ref 96–112)
Creatinine, Ser: 0.74 mg/dL (ref 0.40–1.20)
GFR: 84.1 mL/min (ref >60.00)
Glucose, Bld: 121 mg/dL — ABNORMAL HIGH (ref 70–99)
Potassium: 3.9 mEq/L (ref 3.5–5.1)
Sodium: 135 mEq/L (ref 135–145)
Total Bilirubin: 0.5 mg/dL (ref 0.2–1.2)
Total Protein: 7.9 g/dL (ref 6.0–8.3)

## 2018-12-16 LAB — LIPID PANEL
Cholesterol: 215 mg/dL — ABNORMAL HIGH (ref 0–200)
HDL: 56.8 mg/dL (ref >39.00)
LDL Cholesterol: 141 mg/dL — ABNORMAL HIGH (ref 0–99)
NonHDL: 157.74
Total CHOL/HDL Ratio: 4
Triglycerides: 83 mg/dL (ref 0.0–149.0)
VLDL: 16.6 mg/dL (ref 0.0–40.0)

## 2018-12-16 LAB — URINALYSIS, ROUTINE W REFLEX MICROSCOPIC
Bilirubin Urine: NEGATIVE
Hgb urine dipstick: NEGATIVE
Ketones, ur: NEGATIVE
Leukocytes,Ua: NEGATIVE
Nitrite: NEGATIVE
RBC / HPF: NONE SEEN (ref 0–?)
Specific Gravity, Urine: 1.02 (ref 1.000–1.030)
Total Protein, Urine: NEGATIVE
Urine Glucose: >=1000 — AB
Urobilinogen, UA: 0.2 (ref 0.0–1.0)
WBC, UA: NONE SEEN (ref 0–?)
pH: 6.5 (ref 5.0–8.0)

## 2018-12-16 LAB — HEMOGLOBIN A1C: Hgb A1c MFr Bld: 6.8 % — ABNORMAL HIGH (ref 4.6–6.5)

## 2018-12-16 LAB — TSH: TSH: 2.09 u[IU]/mL (ref 0.35–4.50)

## 2018-12-16 NOTE — Progress Notes (Signed)
   Subjective:   Patient ID: Karen Peters, female    DOB: 01-01-1971, 48 y.o.   MRN: [id number]  HPI The patient is a 48 YO female coming in for physical.   PMH, [place], social history reviewed and updated  Review of Systems  Constitutional: Negative.   HENT: Negative.   Eyes: Negative.   Respiratory: Negative for cough, chest tightness and shortness of breath.   Cardiovascular: Negative for chest pain, palpitations and leg swelling.  Gastrointestinal: Negative for abdominal distention, abdominal pain, constipation, diarrhea, nausea and vomiting.  Musculoskeletal: Negative.   Skin: Negative.   Neurological: Negative.   Psychiatric/Behavioral: Negative.     Objective:  Physical Exam Constitutional:      Appearance: She is well-developed. She is obese.  HENT:     Head: Normocephalic and atraumatic.  Neck:     Musculoskeletal: Normal range of motion.  Cardiovascular:     Rate and Rhythm: Normal rate and regular rhythm.  Pulmonary:     Effort: Pulmonary effort is normal. No respiratory distress.     Breath sounds: Normal breath sounds. No wheezing or rales.  Abdominal:     General: Bowel sounds are normal. There is no distension.     Palpations: Abdomen is soft.     Tenderness: There is no abdominal tenderness. There is no rebound.  Skin:    General: Skin is warm and dry.  Neurological:     Mental Status: She is alert and oriented to person, place, and time.     Coordination: Coordination normal.     Vitals:   12/16/18 0901  BP: 120/84  Pulse: 83  Temp: 98.6 F (37 C)  TempSrc: Oral  SpO2: 98%  Weight: 256 lb (116.1 kg)  Height: 5\' 5"  (1.651 m)    Assessment & Plan:  Flu shot given at visit

## 2018-12-16 NOTE — Patient Instructions (Signed)
We will check the urine and the cholesterol today.   

## 2018-12-17 ENCOUNTER — Encounter: Payer: Self-pay | Admitting: Internal Medicine

## 2018-12-18 MED ORDER — PRAVASTATIN SODIUM 20 MG PO TABS: 20.0000 mg | ORAL_TABLET | Freq: Every day | ORAL | 3 refills | Status: DC

## 2018-12-18 NOTE — Assessment & Plan Note (Signed)
Checking lipid panel. Adjust as needed. Prior bad reaction with statin.

## 2018-12-18 NOTE — Assessment & Plan Note (Signed)
Flu shot given. Pneumonia declines today. Tetanus declines today. Pap smear with gyn. Counseled about sun safety and mole surveillance. Counseled about the dangers of distracted driving. Given 10 year screening recommendations.

## 2018-12-18 NOTE — Assessment & Plan Note (Signed)
Checking HgA1c and foot exam. Taking metformin and farxiga. On ACE-I.

## 2018-12-18 NOTE — Assessment & Plan Note (Signed)
Weight up due to pandemic and she is working on this.

## 2018-12-23 ENCOUNTER — Other Ambulatory Visit: Payer: Self-pay | Admitting: Internal Medicine

## 2019-07-09 ENCOUNTER — Other Ambulatory Visit: Payer: Self-pay

## 2019-07-09 ENCOUNTER — Ambulatory Visit: Payer: 59 | Attending: Internal Medicine

## 2019-07-09 ENCOUNTER — Ambulatory Visit (INDEPENDENT_AMBULATORY_CARE_PROVIDER_SITE_OTHER): Payer: 59 | Admitting: Internal Medicine

## 2019-07-09 ENCOUNTER — Encounter: Payer: Self-pay | Admitting: Internal Medicine

## 2019-07-09 VITALS — BP 124/82 | HR 85 | Temp 98.6°F | Ht 65.0 in | Wt 263.0 lb

## 2019-07-09 DIAGNOSIS — Z9989 Dependence on other enabling machines and devices: Secondary | ICD-10-CM

## 2019-07-09 DIAGNOSIS — E1169 Type 2 diabetes mellitus with other specified complication: Secondary | ICD-10-CM

## 2019-07-09 DIAGNOSIS — E785 Hyperlipidemia, unspecified: Secondary | ICD-10-CM

## 2019-07-09 DIAGNOSIS — E119 Type 2 diabetes mellitus without complications: Secondary | ICD-10-CM

## 2019-07-09 DIAGNOSIS — G4733 Obstructive sleep apnea (adult) (pediatric): Secondary | ICD-10-CM

## 2019-07-09 DIAGNOSIS — Z23 Encounter for immunization: Secondary | ICD-10-CM

## 2019-07-09 LAB — COMPREHENSIVE METABOLIC PANEL
ALT: 29 U/L (ref 0–35)
AST: 21 U/L (ref 0–37)
Albumin: 4 g/dL (ref 3.5–5.2)
Alkaline Phosphatase: 107 U/L (ref 39–117)
BUN: 12 mg/dL (ref 6–23)
CO2: 27 mEq/L (ref 19–32)
Calcium: 9.1 mg/dL (ref 8.4–10.5)
Chloride: 98 mEq/L (ref 96–112)
Creatinine, Ser: 0.7 mg/dL (ref 0.40–1.20)
GFR: 89.46 mL/min (ref >60.00)
Glucose, Bld: 110 mg/dL — ABNORMAL HIGH (ref 70–99)
Potassium: 3.7 mEq/L (ref 3.5–5.1)
Sodium: 135 mEq/L (ref 135–145)
Total Bilirubin: 0.4 mg/dL (ref 0.2–1.2)
Total Protein: 7.5 g/dL (ref 6.0–8.3)

## 2019-07-09 LAB — LIPID PANEL
Cholesterol: 171 mg/dL (ref 0–200)
HDL: 51.1 mg/dL (ref >39.00)
LDL Cholesterol: 102 mg/dL — ABNORMAL HIGH (ref 0–99)
NonHDL: 120.23
Total CHOL/HDL Ratio: 3
Triglycerides: 90 mg/dL (ref 0.0–149.0)
VLDL: 18 mg/dL (ref 0.0–40.0)

## 2019-07-09 LAB — HEMOGLOBIN A1C: Hgb A1c MFr Bld: 6.7 % — ABNORMAL HIGH (ref 4.6–6.5)

## 2019-07-09 NOTE — Assessment & Plan Note (Signed)
Has autopap currently and she is not sure it is working well. Would like to pursue cpap titration study once she is vaccinated and will reach out to [place] when ready.

## 2019-07-09 NOTE — Assessment & Plan Note (Signed)
Weight up slightly since last visit. Checking HgA1c and adjust farxiga and metformin as needed. On ACE-I and statin.

## 2019-07-09 NOTE — Progress Notes (Signed)
   Subjective:   Patient ID: Karen Peters, female    DOB: [birth year], 49 y.o.   MRN: [id number]  HPI The patient is a 49 YO female coming in for follow up diabetes (taking farxiga and metformin, weight is up slightly, working on decreasing this, just started intermittent fasting, sometimes wakes up at 3am with shaking and has not checked sugar level then) and cholesterol (switched to pravastatin last visit, she has not had any problems with this, denies chest pains or stroke symptoms) and OSA (using CPAP only rarely due to concern for germs and being unable to clean well, she also is worried with weight change it is not right anymore and wishes to do sleep titration once she gets covid-19 vaccine, it does seem to help when using, denies morning headaches).   Review of Systems  Constitutional: Negative.   HENT: Negative.   Eyes: Negative.   Respiratory: Negative for cough, chest tightness and shortness of breath.   Cardiovascular: Negative for chest pain, palpitations and leg swelling.  Gastrointestinal: Negative for abdominal distention, abdominal pain, constipation, diarrhea, nausea and vomiting.  Musculoskeletal: Negative.   Skin: Negative.   Neurological: Negative.   Psychiatric/Behavioral: Negative.     Objective:  Physical Exam Constitutional:      Appearance: She is well-developed. She is obese.  HENT:     Head: Normocephalic and atraumatic.  Cardiovascular:     Rate and Rhythm: Normal rate and regular rhythm.  Pulmonary:     Effort: Pulmonary effort is normal. No respiratory distress.     Breath sounds: Normal breath sounds. No wheezing or rales.  Abdominal:     General: Bowel sounds are normal. There is no distension.     Palpations: Abdomen is soft.     Tenderness: There is no abdominal tenderness. There is no rebound.  Musculoskeletal:     Cervical back: Normal range of motion.  Skin:    General: Skin is warm and dry.  Neurological:     Mental Status: She is alert  and oriented to person, place, and time.     Coordination: Coordination normal.     Vitals:   07/09/19 0946  BP: 124/82  Pulse: 85  Temp: 98.6 F (37 C)  TempSrc: Oral  SpO2: 96%  Weight: 263 lb (119.3 kg)  Height: 5\' 5"  (1.651 m)    This visit occurred during the SARS-CoV-2 public health emergency.  Safety protocols were in place, including screening questions prior to the visit, additional usage of staff PPE, and extensive cleaning of exam room while observing appropriate contact time as indicated for disinfecting solutions.   Assessment & Plan:

## 2019-07-09 NOTE — Patient Instructions (Signed)
We will check the labs today. 

## 2019-07-09 NOTE — Assessment & Plan Note (Signed)
Checking lipid panel on pravastatin 20 mg daily and adjust as needed. Previously not at goal.

## 2019-07-09 NOTE — Progress Notes (Signed)
   Covid-19 Vaccination Clinic  Name:  Karen Peters    MRN: [id number] DOB: [birth year]  07/09/2019  [name] was observed post Covid-19 immunization for 15 minutes without incident. She was provided with Vaccine Information Sheet and instruction to access the V-Safe system.   [name] was instructed to call 911 with any severe reactions post vaccine: Marland Kitchen Difficulty breathing  . Swelling of face and throat  . A fast heartbeat  . A bad rash all over body  . Dizziness and weakness   Immunizations Administered    Name Date Dose VIS Date Route   Pfizer COVID-19 Vaccine 07/09/2019  1:10 PM 0.3 mL 03/12/2019 Intramuscular   Manufacturer: [employer], [employer]   Lot: [id number]   NDC: [address]-9

## 2019-08-01 ENCOUNTER — Other Ambulatory Visit: Payer: Self-pay | Admitting: Internal Medicine

## 2019-08-02 ENCOUNTER — Ambulatory Visit: Payer: 59 | Attending: Internal Medicine

## 2019-08-02 DIAGNOSIS — Z23 Encounter for immunization: Secondary | ICD-10-CM

## 2019-08-02 NOTE — Progress Notes (Signed)
   Covid-19 Vaccination Clinic  Name:  Karen Peters    MRN: [id number] DOB: [birth year]  08/02/2019  [name] was observed post Covid-19 immunization for 15 minutes without incident. She was provided with Vaccine Information Sheet and instruction to access the V-Safe system.   [name] was instructed to call 911 with any severe reactions post vaccine: Marland Kitchen Difficulty breathing  . Swelling of face and throat  . A fast heartbeat  . A bad rash all over body  . Dizziness and weakness   Immunizations Administered    Name Date Dose VIS Date Route   Pfizer COVID-19 Vaccine 08/02/2019 10:56 AM 0.3 mL 05/26/2018 Intramuscular   Manufacturer: [employer], [employer]   Lot: [id number]   NDC: [address]-8

## 2019-08-03 ENCOUNTER — Other Ambulatory Visit: Payer: Self-pay | Admitting: Internal Medicine

## 2019-09-22 ENCOUNTER — Other Ambulatory Visit: Payer: Self-pay | Admitting: Internal Medicine

## 2019-10-31 ENCOUNTER — Other Ambulatory Visit: Payer: Self-pay | Admitting: Internal Medicine

## 2020-01-27 ENCOUNTER — Other Ambulatory Visit: Payer: Self-pay | Admitting: Internal Medicine

## 2020-01-28 ENCOUNTER — Other Ambulatory Visit: Payer: Self-pay | Admitting: Internal Medicine

## 2020-01-29 ENCOUNTER — Other Ambulatory Visit: Payer: Self-pay | Admitting: Internal Medicine

## 2020-02-09 ENCOUNTER — Ambulatory Visit: Payer: 59 | Admitting: Internal Medicine

## 2020-03-08 ENCOUNTER — Other Ambulatory Visit: Payer: Self-pay

## 2020-03-08 ENCOUNTER — Encounter: Payer: Self-pay | Admitting: Internal Medicine

## 2020-03-08 ENCOUNTER — Ambulatory Visit (INDEPENDENT_AMBULATORY_CARE_PROVIDER_SITE_OTHER): Payer: No Typology Code available for payment source | Admitting: Internal Medicine

## 2020-03-08 VITALS — BP 140/72 | HR 84 | Resp 18 | Ht 65.0 in | Wt 258.4 lb

## 2020-03-08 DIAGNOSIS — Z23 Encounter for immunization: Secondary | ICD-10-CM

## 2020-03-08 DIAGNOSIS — E1169 Type 2 diabetes mellitus with other specified complication: Secondary | ICD-10-CM

## 2020-03-08 DIAGNOSIS — E785 Hyperlipidemia, unspecified: Secondary | ICD-10-CM

## 2020-03-08 DIAGNOSIS — E118 Type 2 diabetes mellitus with unspecified complications: Secondary | ICD-10-CM

## 2020-03-08 DIAGNOSIS — I10 Essential (primary) hypertension: Secondary | ICD-10-CM

## 2020-03-08 DIAGNOSIS — Z0000 Encounter for general adult medical examination without abnormal findings: Secondary | ICD-10-CM

## 2020-03-08 LAB — TSH: TSH: 2.63 u[IU]/mL (ref 0.35–4.50)

## 2020-03-08 LAB — COMPREHENSIVE METABOLIC PANEL
ALT: 52 U/L — ABNORMAL HIGH (ref 0–35)
AST: 40 U/L — ABNORMAL HIGH (ref 0–37)
Albumin: 4 g/dL (ref 3.5–5.2)
Alkaline Phosphatase: 141 U/L — ABNORMAL HIGH (ref 39–117)
BUN: 8 mg/dL (ref 6–23)
CO2: 28 mEq/L (ref 19–32)
Calcium: 9.2 mg/dL (ref 8.4–10.5)
Chloride: 100 mEq/L (ref 96–112)
Creatinine, Ser: 0.67 mg/dL (ref 0.40–1.20)
GFR: 103.45 mL/min (ref >60.00)
Glucose, Bld: 114 mg/dL — ABNORMAL HIGH (ref 70–99)
Potassium: 3.7 mEq/L (ref 3.5–5.1)
Sodium: 135 mEq/L (ref 135–145)
Total Bilirubin: 0.4 mg/dL (ref 0.2–1.2)
Total Protein: 7.9 g/dL (ref 6.0–8.3)

## 2020-03-08 LAB — VITAMIN B12: Vitamin B-12: 523 pg/mL (ref 211–911)

## 2020-03-08 LAB — CBC
HCT: 45.5 % (ref 36.0–46.0)
Hemoglobin: 14.8 g/dL (ref 12.0–15.0)
MCHC: 32.6 g/dL (ref 30.0–36.0)
MCV: 82.8 fl (ref 78.0–100.0)
Platelets: 303 10*3/uL (ref 150.0–400.0)
RBC: 5.49 Mil/uL — ABNORMAL HIGH (ref 3.87–5.11)
RDW: 16.7 % — ABNORMAL HIGH (ref 11.5–15.5)
WBC: 7 10*3/uL (ref 4.0–10.5)

## 2020-03-08 LAB — HEMOGLOBIN A1C: Hgb A1c MFr Bld: 6.4 % (ref 4.6–6.5)

## 2020-03-08 LAB — VITAMIN D 25 HYDROXY (VIT D DEFICIENCY, FRACTURES): VITD: 21.49 ng/mL — ABNORMAL LOW (ref 30.00–100.00)

## 2020-03-08 MED ORDER — DAPAGLIFLOZIN PROPANEDIOL 10 MG PO TABS
10.0000 mg | ORAL_TABLET | Freq: Every day | ORAL | 3 refills | Status: DC
Start: 2020-03-08 — End: 2020-09-29

## 2020-03-08 MED ORDER — PRAVASTATIN SODIUM 20 MG PO TABS
20.0000 mg | ORAL_TABLET | Freq: Every day | ORAL | 3 refills | Status: DC
Start: 2020-03-08 — End: 2021-03-21

## 2020-03-08 MED ORDER — METFORMIN HCL ER 500 MG PO TB24
500.0000 mg | ORAL_TABLET | Freq: Every day | ORAL | 3 refills | Status: DC
Start: 2020-03-08 — End: 2021-04-09

## 2020-03-08 MED ORDER — LEVOCETIRIZINE DIHYDROCHLORIDE 5 MG PO TABS
5.0000 mg | ORAL_TABLET | Freq: Every evening | ORAL | 3 refills | Status: DC
Start: 2020-03-08 — End: 2021-04-09

## 2020-03-08 MED ORDER — CYCLOBENZAPRINE HCL 5 MG PO TABS
5.0000 mg | ORAL_TABLET | Freq: Three times a day (TID) | ORAL | 1 refills | Status: DC | PRN
Start: 2020-03-08 — End: 2021-12-12

## 2020-03-08 NOTE — Patient Instructions (Addendum)
We will have you increase to 1 pill daily of the blood pressure medicine.   We have sent in flexeril (cyclobenzaprine) to use if needed for the spasms.  We will check the labs today and get back about the results.

## 2020-03-08 NOTE — Progress Notes (Signed)
   Subjective:   Patient ID: Karen Peters, female    DOB: [birth year], 49 y.o.   MRN: [id number]  HPI The patient is a 49 YO female coming in for physical.   PMH, FMH, social history reviewed and updated  Review of Systems  Constitutional: Positive for activity change and appetite change.  HENT: Negative.   Eyes: Negative.   Respiratory: Negative for cough, chest tightness and shortness of breath.   Cardiovascular: Negative for chest pain, palpitations and leg swelling.  Gastrointestinal: Negative for abdominal distention, abdominal pain, constipation, diarrhea, nausea and vomiting.  Musculoskeletal: Negative.   Skin: Negative.   Neurological: Positive for numbness.  Psychiatric/Behavioral: Negative.     Objective:  Physical Exam Constitutional:      Appearance: She is well-developed and well-nourished. She is obese.  HENT:     Head: Normocephalic and atraumatic.  Eyes:     Extraocular Movements: EOM normal.  Cardiovascular:     Rate and Rhythm: Normal rate and regular rhythm.  Pulmonary:     Effort: Pulmonary effort is normal. No respiratory distress.     Breath sounds: Normal breath sounds. No wheezing or rales.  Abdominal:     General: Bowel sounds are normal. There is no distension.     Palpations: Abdomen is soft.     Tenderness: There is no abdominal tenderness. There is no rebound.  Musculoskeletal:        General: No edema.     Cervical back: Normal range of motion.  Skin:    General: Skin is warm and dry.     Comments: Foot exam done  Neurological:     Mental Status: She is alert and oriented to person, place, and time.     Coordination: Coordination normal.  Psychiatric:        Mood and Affect: Mood and affect normal.     Vitals:   03/08/20 1002  BP: 140/72  Pulse: 84  Resp: 18  SpO2: 96%  Weight: 258 lb 6.4 oz (117.2 kg)  Height: 5\' 5"  (1.651 m)   This visit occurred during the SARS-CoV-2 public health emergency.  Safety protocols were in  place, including screening questions prior to the visit, additional usage of staff PPE, and extensive cleaning of exam room while observing appropriate contact time as indicated for disinfecting solutions.   Assessment & Plan:  Pneumonia 23 given at visit

## 2020-03-10 NOTE — Assessment & Plan Note (Signed)
Flu shot up to date. Covid-19 up to date including booster. Pneumonia 23 given at visit. Tetanus declines today. Mammogram up to date, pap smear will get with gyn. Counseled about sun safety and mole surveillance. Counseled about the dangers of distracted driving. Given 10 year screening recommendations.

## 2020-03-10 NOTE — Assessment & Plan Note (Signed)
Foot exam done. Checking HgA1c and lipid panel. Adjust metformin and farxiga as needed. Taking statin and ACE-I.

## 2020-03-10 NOTE — Assessment & Plan Note (Signed)
Checking lipid panel and adjust pravastatin 20 mg daily as needed. 

## 2020-03-10 NOTE — Assessment & Plan Note (Signed)
Counseled on diet and exercise to work on weight loss.

## 2020-03-10 NOTE — Assessment & Plan Note (Signed)
BP at goal on lisinopril/hctz 20/12.5 mg and checking CMP and adjust as needed.

## 2020-03-22 ENCOUNTER — Encounter: Payer: Self-pay | Admitting: Internal Medicine

## 2020-03-23 ENCOUNTER — Other Ambulatory Visit: Payer: Self-pay | Admitting: *Deleted

## 2020-03-23 MED ORDER — LISINOPRIL-HYDROCHLOROTHIAZIDE 20-12.5 MG PO TABS
1.0000 | ORAL_TABLET | Freq: Every day | ORAL | 1 refills | Status: DC
Start: 2020-03-23 — End: 2020-09-25

## 2020-07-01 ENCOUNTER — Encounter: Payer: Self-pay | Admitting: Internal Medicine

## 2020-09-02 ENCOUNTER — Encounter: Payer: Self-pay | Admitting: Internal Medicine

## 2020-09-25 ENCOUNTER — Encounter: Payer: Self-pay | Admitting: Internal Medicine

## 2020-09-25 ENCOUNTER — Other Ambulatory Visit: Payer: Self-pay | Admitting: Internal Medicine

## 2020-09-29 ENCOUNTER — Other Ambulatory Visit: Payer: Self-pay

## 2020-09-29 ENCOUNTER — Ambulatory Visit (INDEPENDENT_AMBULATORY_CARE_PROVIDER_SITE_OTHER): Payer: No Typology Code available for payment source | Admitting: Internal Medicine

## 2020-09-29 ENCOUNTER — Encounter: Payer: Self-pay | Admitting: Internal Medicine

## 2020-09-29 VITALS — BP 126/78 | HR 85 | Temp 98.3°F | Resp 18 | Ht 68.0 in | Wt 257.8 lb

## 2020-09-29 DIAGNOSIS — E559 Vitamin D deficiency, unspecified: Secondary | ICD-10-CM | POA: Diagnosis not present

## 2020-09-29 DIAGNOSIS — E118 Type 2 diabetes mellitus with unspecified complications: Secondary | ICD-10-CM

## 2020-09-29 DIAGNOSIS — E538 Deficiency of other specified B group vitamins: Secondary | ICD-10-CM

## 2020-09-29 LAB — VITAMIN B12: Vitamin B-12: 1013 pg/mL — ABNORMAL HIGH (ref 211–911)

## 2020-09-29 LAB — HEMOGLOBIN A1C: Hgb A1c MFr Bld: 6.4 % (ref 4.6–6.5)

## 2020-09-29 LAB — VITAMIN D 25 HYDROXY (VIT D DEFICIENCY, FRACTURES): VITD: 25.89 ng/mL — ABNORMAL LOW (ref 30.00–100.00)

## 2020-09-29 NOTE — Assessment & Plan Note (Signed)
Taking metformin 500 mg XR now. Checking HgA1c since she is off farxiga. Adjust as needed.

## 2020-09-29 NOTE — Patient Instructions (Signed)
We will check the levels today and then consider changing the dose of the metformin if we need to.

## 2020-09-29 NOTE — Progress Notes (Signed)
   Subjective:   Patient ID: Karen Peters, female    DOB: [birth year], 50 y.o.   MRN: [id number]  HPI The patient is a 50 YO female coming in for follow up diabetes. Stopped farxiga about 5 weeks ago and has made a lot of diet and exercise changes. Not getting 10K steps per day due to job/working from home. She is stable on weight. Not having trouble with yeast infection now that she is off farxiga.   Review of Systems  Constitutional: Negative.   HENT: Negative.    Eyes: Negative.   Respiratory:  Negative for cough, chest tightness and shortness of breath.   Cardiovascular:  Negative for chest pain, palpitations and leg swelling.  Gastrointestinal:  Negative for abdominal distention, abdominal pain, constipation, diarrhea, nausea and vomiting.  Musculoskeletal: Negative.   Skin: Negative.   Neurological: Negative.   Psychiatric/Behavioral: Negative.     Objective:  Physical Exam Constitutional:      Appearance: She is well-developed. She is obese.  HENT:     Head: Normocephalic and atraumatic.  Cardiovascular:     Rate and Rhythm: Normal rate and regular rhythm.  Pulmonary:     Effort: Pulmonary effort is normal. No respiratory distress.     Breath sounds: Normal breath sounds. No wheezing or rales.  Abdominal:     General: Bowel sounds are normal. There is no distension.     Palpations: Abdomen is soft.     Tenderness: There is no abdominal tenderness. There is no rebound.  Musculoskeletal:     Cervical back: Normal range of motion.  Skin:    General: Skin is warm and dry.  Neurological:     Mental Status: She is alert and oriented to person, place, and time.     Coordination: Coordination normal.    Vitals:   09/29/20 0900  BP: 126/78  Pulse: 85  Resp: 18  Temp: 98.3 F (36.8 C)  TempSrc: Oral  SpO2: 95%  Weight: 257 lb 12.8 oz (116.9 kg)  Height: 5\' 8"  (1.727 m)    This visit occurred during the SARS-CoV-2 public health emergency.  Safety protocols were in  place, including screening questions prior to the visit, additional usage of staff PPE, and extensive cleaning of exam room while observing appropriate contact time as indicated for disinfecting solutions.   Assessment & Plan:

## 2020-09-29 NOTE — Assessment & Plan Note (Signed)
Weight overall stable. We talked about wellbutrin as a possible option to help with weight loss.

## 2020-10-29 ENCOUNTER — Other Ambulatory Visit: Payer: Self-pay | Admitting: Internal Medicine

## 2021-02-06 ENCOUNTER — Encounter: Payer: Self-pay | Admitting: Internal Medicine

## 2021-02-16 ENCOUNTER — Encounter: Payer: Self-pay | Admitting: Internal Medicine

## 2021-03-20 ENCOUNTER — Other Ambulatory Visit: Payer: Self-pay | Admitting: Internal Medicine

## 2021-04-07 ENCOUNTER — Other Ambulatory Visit: Payer: Self-pay | Admitting: Internal Medicine

## 2021-04-09 ENCOUNTER — Other Ambulatory Visit: Payer: Self-pay | Admitting: Internal Medicine

## 2021-04-22 ENCOUNTER — Other Ambulatory Visit: Payer: Self-pay | Admitting: Internal Medicine

## 2021-05-03 ENCOUNTER — Other Ambulatory Visit: Payer: Self-pay | Admitting: Internal Medicine

## 2021-05-14 ENCOUNTER — Other Ambulatory Visit: Payer: Self-pay | Admitting: Internal Medicine

## 2021-05-14 ENCOUNTER — Other Ambulatory Visit: Payer: Self-pay

## 2021-05-14 ENCOUNTER — Ambulatory Visit (INDEPENDENT_AMBULATORY_CARE_PROVIDER_SITE_OTHER): Payer: No Typology Code available for payment source | Admitting: Internal Medicine

## 2021-05-14 ENCOUNTER — Encounter: Payer: Self-pay | Admitting: Internal Medicine

## 2021-05-14 VITALS — BP 124/80 | HR 91 | Resp 18 | Ht 68.0 in | Wt 275.6 lb

## 2021-05-14 DIAGNOSIS — Z0000 Encounter for general adult medical examination without abnormal findings: Secondary | ICD-10-CM

## 2021-05-14 DIAGNOSIS — E785 Hyperlipidemia, unspecified: Secondary | ICD-10-CM

## 2021-05-14 DIAGNOSIS — E1169 Type 2 diabetes mellitus with other specified complication: Secondary | ICD-10-CM | POA: Diagnosis not present

## 2021-05-14 DIAGNOSIS — E118 Type 2 diabetes mellitus with unspecified complications: Secondary | ICD-10-CM | POA: Diagnosis not present

## 2021-05-14 LAB — CBC
HCT: 39.8 % (ref 36.0–46.0)
Hemoglobin: 13 g/dL (ref 12.0–15.0)
MCHC: 32.6 g/dL (ref 30.0–36.0)
MCV: 83.3 fl (ref 78.0–100.0)
Platelets: 356 10*3/uL (ref 150.0–400.0)
RBC: 4.78 Mil/uL (ref 3.87–5.11)
RDW: 15.8 % — ABNORMAL HIGH (ref 11.5–15.5)
WBC: 6.2 10*3/uL (ref 4.0–10.5)

## 2021-05-14 LAB — VITAMIN B12: Vitamin B-12: 814 pg/mL (ref 211–911)

## 2021-05-14 LAB — HEMOGLOBIN A1C: Hgb A1c MFr Bld: 7.7 % — ABNORMAL HIGH (ref 4.6–6.5)

## 2021-05-14 MED ORDER — EMPAGLIFLOZIN 10 MG PO TABS: 10.0000 mg | ORAL_TABLET | Freq: Every day | ORAL | 0 refills | Status: DC

## 2021-05-14 NOTE — Patient Instructions (Addendum)
We will check the labs today.  We have sent in jardiance 10 mg daily to take for several months with the sugars/diabetes.

## 2021-05-14 NOTE — Progress Notes (Signed)
° °  Subjective:   Patient ID: Karen Peters, female    DOB: 01/04/1971, 51 y.o.   MRN: [id number]  HPI The patient is a 51 YO female coming in for follow up and weight gain.  Review of Systems  Constitutional:  Positive for unexpected weight change.  HENT: Negative.    Eyes: Negative.   Respiratory:  Negative for cough, chest tightness and shortness of breath.   Cardiovascular:  Negative for chest pain, palpitations and leg swelling.  Gastrointestinal:  Negative for abdominal distention, abdominal pain, constipation, diarrhea, nausea and vomiting.  Musculoskeletal:  Positive for myalgias.  Skin: Negative.   Neurological: Negative.   Psychiatric/Behavioral: Negative.     Objective:  Physical Exam Constitutional:      Appearance: She is well-developed. She is obese.  HENT:     Head: Normocephalic and atraumatic.  Cardiovascular:     Rate and Rhythm: Normal rate and regular rhythm.  Pulmonary:     Effort: Pulmonary effort is normal. No respiratory distress.     Breath sounds: Normal breath sounds. No wheezing or rales.  Abdominal:     General: Bowel sounds are normal. There is no distension.     Palpations: Abdomen is soft.     Tenderness: There is no abdominal tenderness. There is no rebound.  Musculoskeletal:     Cervical back: Normal range of motion.  Skin:    General: Skin is warm and dry.     Comments: Foot exam done  Neurological:     Mental Status: She is alert and oriented to person, place, and time.     Coordination: Coordination normal.    Vitals:   05/14/21 1016  BP: 124/80  Pulse: 91  Resp: 18  SpO2: 95%  Weight: 275 lb 9.6 oz (125 kg)  Height: 5\' 8"  (1.727 m)    This visit occurred during the SARS-CoV-2 public health emergency.  Safety protocols were in place, including screening questions prior to the visit, additional usage of staff PPE, and extensive cleaning of exam room while observing appropriate contact time as indicated for disinfecting  solutions.   Assessment & Plan:

## 2021-05-15 LAB — COMPREHENSIVE METABOLIC PANEL
ALT: 43 U/L — ABNORMAL HIGH (ref 0–35)
AST: 26 U/L (ref 0–37)
Albumin: 4 g/dL (ref 3.5–5.2)
Alkaline Phosphatase: 119 U/L — ABNORMAL HIGH (ref 39–117)
BUN: 13 mg/dL (ref 6–23)
CO2: 28 mEq/L (ref 19–32)
Calcium: 9.3 mg/dL (ref 8.4–10.5)
Chloride: 98 mEq/L (ref 96–112)
Creatinine, Ser: 0.69 mg/dL (ref 0.40–1.20)
GFR: 101.87 mL/min (ref >60.00)
Glucose, Bld: 151 mg/dL — ABNORMAL HIGH (ref 70–99)
Potassium: 4.2 mEq/L (ref 3.5–5.1)
Sodium: 134 mEq/L — ABNORMAL LOW (ref 135–145)
Total Bilirubin: 0.2 mg/dL (ref 0.2–1.2)
Total Protein: 7.5 g/dL (ref 6.0–8.3)

## 2021-05-15 LAB — LIPID PANEL
Cholesterol: 206 mg/dL — ABNORMAL HIGH (ref 0–200)
HDL: 52.4 mg/dL (ref >39.00)
LDL Cholesterol: 126 mg/dL — ABNORMAL HIGH (ref 0–99)
NonHDL: 153.63
Total CHOL/HDL Ratio: 4
Triglycerides: 139 mg/dL (ref 0.0–149.0)
VLDL: 27.8 mg/dL (ref 0.0–40.0)

## 2021-05-16 NOTE — Assessment & Plan Note (Signed)
Her diet has been worse recently and weight is up. She is taking metformin 500 mg daily and wishes to resume jardiance. We will resume at 10 mg daily. Checking HgA1c, foot exam done. Is taking pravastatin 20 mg daily and ACE-I. Adjust as needed.

## 2021-05-16 NOTE — Assessment & Plan Note (Signed)
Weight is increasing. She has tried glp-1 before without good results. We will add jardiance 10 mg daily to her metformin 500 mg daily to help.

## 2021-05-16 NOTE — Assessment & Plan Note (Signed)
Checking lipid panel and adjust as needed. She is having joint pains from pravastatin 20 mg daily and may need to stop temporarily.

## 2021-06-06 ENCOUNTER — Other Ambulatory Visit: Payer: Self-pay | Admitting: Internal Medicine

## 2021-06-11 ENCOUNTER — Other Ambulatory Visit: Payer: Self-pay | Admitting: Internal Medicine

## 2021-08-05 ENCOUNTER — Other Ambulatory Visit: Payer: Self-pay | Admitting: Internal Medicine

## 2021-09-07 ENCOUNTER — Other Ambulatory Visit: Payer: Self-pay | Admitting: Internal Medicine

## 2021-10-04 ENCOUNTER — Other Ambulatory Visit: Payer: Self-pay | Admitting: Internal Medicine

## 2021-11-05 ENCOUNTER — Other Ambulatory Visit: Payer: Self-pay | Admitting: Internal Medicine

## 2021-11-05 MED ORDER — EMPAGLIFLOZIN 10 MG PO TABS: 10.0000 mg | ORAL_TABLET | Freq: Every day | ORAL | 0 refills | Status: DC

## 2021-11-07 ENCOUNTER — Other Ambulatory Visit: Payer: Self-pay

## 2021-11-07 MED ORDER — LISINOPRIL-HYDROCHLOROTHIAZIDE 20-12.5 MG PO TABS: 1.0000 | ORAL_TABLET | Freq: Every day | ORAL | 0 refills | Status: DC

## 2021-11-07 MED ORDER — EMPAGLIFLOZIN 10 MG PO TABS: 10.0000 mg | ORAL_TABLET | Freq: Every day | ORAL | 0 refills | Status: DC

## 2021-11-15 ENCOUNTER — Ambulatory Visit: Payer: No Typology Code available for payment source | Admitting: Internal Medicine

## 2021-12-04 ENCOUNTER — Ambulatory Visit: Payer: No Typology Code available for payment source | Admitting: Internal Medicine

## 2021-12-04 ENCOUNTER — Other Ambulatory Visit: Payer: Self-pay | Admitting: Internal Medicine

## 2021-12-11 ENCOUNTER — Ambulatory Visit: Payer: No Typology Code available for payment source | Admitting: Internal Medicine

## 2021-12-12 ENCOUNTER — Ambulatory Visit (INDEPENDENT_AMBULATORY_CARE_PROVIDER_SITE_OTHER): Payer: No Typology Code available for payment source | Admitting: Internal Medicine

## 2021-12-12 ENCOUNTER — Encounter: Payer: Self-pay | Admitting: Internal Medicine

## 2021-12-12 VITALS — BP 124/82 | HR 74 | Ht 68.0 in | Wt 264.0 lb

## 2021-12-12 DIAGNOSIS — G4733 Obstructive sleep apnea (adult) (pediatric): Secondary | ICD-10-CM

## 2021-12-12 DIAGNOSIS — Z9989 Dependence on other enabling machines and devices: Secondary | ICD-10-CM

## 2021-12-12 DIAGNOSIS — E118 Type 2 diabetes mellitus with unspecified complications: Secondary | ICD-10-CM | POA: Diagnosis not present

## 2021-12-12 DIAGNOSIS — Z0000 Encounter for general adult medical examination without abnormal findings: Secondary | ICD-10-CM | POA: Diagnosis not present

## 2021-12-12 DIAGNOSIS — E1169 Type 2 diabetes mellitus with other specified complication: Secondary | ICD-10-CM

## 2021-12-12 DIAGNOSIS — E785 Hyperlipidemia, unspecified: Secondary | ICD-10-CM

## 2021-12-12 DIAGNOSIS — Z1211 Encounter for screening for malignant neoplasm of colon: Secondary | ICD-10-CM | POA: Diagnosis not present

## 2021-12-12 DIAGNOSIS — I10 Essential (primary) hypertension: Secondary | ICD-10-CM

## 2021-12-12 LAB — POCT GLYCOSYLATED HEMOGLOBIN (HGB A1C): Hemoglobin A1C: 7.2 % — AB (ref 4.0–5.6)

## 2021-12-12 NOTE — Patient Instructions (Signed)
We will get the colon cancer screening to your house and will order the sleep test.

## 2021-12-12 NOTE — Progress Notes (Signed)
   Subjective:   Patient ID: Karen Peters, female    DOB: 1970-08-30, 51 y.o.   MRN: [id number]  HPI The patient is here for physical.  PMH, [facility], social history reviewed and updated  Review of Systems  Constitutional: Negative.   HENT: Negative.    Eyes: Negative.   Respiratory:  Negative for cough, chest tightness and shortness of breath.   Cardiovascular:  Negative for chest pain, palpitations and leg swelling.  Gastrointestinal:  Negative for abdominal distention, abdominal pain, constipation, diarrhea, nausea and vomiting.  Musculoskeletal: Negative.   Skin: Negative.   Neurological: Negative.   Psychiatric/Behavioral: Negative.      Objective:  Physical Exam Constitutional:      Appearance: She is well-developed. She is obese.  HENT:     Head: Normocephalic and atraumatic.  Cardiovascular:     Rate and Rhythm: Normal rate and regular rhythm.  Pulmonary:     Effort: Pulmonary effort is normal. No respiratory distress.     Breath sounds: Normal breath sounds. No wheezing or rales.  Abdominal:     General: Bowel sounds are normal. There is no distension.     Palpations: Abdomen is soft.     Tenderness: There is no abdominal tenderness. There is no rebound.  Musculoskeletal:     Cervical back: Normal range of motion.  Skin:    General: Skin is warm and dry.  Neurological:     Mental Status: She is alert and oriented to person, place, and time.     Coordination: Coordination normal.     Vitals:   12/12/21 1037  BP: 124/82  Pulse: 74  SpO2: 98%  Weight: 264 lb (119.7 kg)  Height: 5\' 8"  (1.727 m)    Assessment & Plan:

## 2021-12-14 NOTE — Assessment & Plan Note (Signed)
Needs updated home sleep test which is ordered.

## 2021-12-14 NOTE — Assessment & Plan Note (Signed)
Flu shot yearly. Covid-19 counseled. Shingrix counseled will do later date. Tetanus up to date. Colonoscopy up to date. Mammogram with gyn up to date, pap smear up to date with gyn. Counseled about sun safety and mole surveillance. Counseled about the dangers of distracted driving. Given 10 year screening recommendations.

## 2021-12-14 NOTE — Assessment & Plan Note (Signed)
POC HgA1c is at goal. Continue jardiance 10 mg daily and metformin 500 mg xr daily. On ACE-I and statin. Continue same. Checking microalbumin to creatinine ratio.

## 2021-12-14 NOTE — Assessment & Plan Note (Signed)
BP at goal on lisinopril/hct 20/12.5 mg daily. Checking CMP and adjust as needed.

## 2021-12-14 NOTE — Assessment & Plan Note (Signed)
Recent lipid panel at goal on pravastatin 20 mg daily and will continue.

## 2022-01-06 LAB — COLOGUARD: COLOGUARD: NEGATIVE

## 2022-01-14 ENCOUNTER — Other Ambulatory Visit: Payer: Self-pay | Admitting: Internal Medicine

## 2022-01-29 ENCOUNTER — Ambulatory Visit (HOSPITAL_BASED_OUTPATIENT_CLINIC_OR_DEPARTMENT_OTHER): Payer: No Typology Code available for payment source | Attending: Internal Medicine | Admitting: Internal Medicine

## 2022-01-29 DIAGNOSIS — G4733 Obstructive sleep apnea (adult) (pediatric): Secondary | ICD-10-CM | POA: Insufficient documentation

## 2022-02-02 DIAGNOSIS — G4733 Obstructive sleep apnea (adult) (pediatric): Secondary | ICD-10-CM | POA: Diagnosis not present

## 2022-02-02 NOTE — Procedures (Signed)
    Patient Name: Karen Peters, Karen Peters Date: 01/29/2022 Gender: Female D.O.B: 09/20/1970 Age (years): 79 Referring Provider: Pricilla Holm Height (inches): 64 Interpreting Physician: Baird Lyons MD, ABSM Weight (lbs): 260 RPSGT: Jacolyn Reedy BMI: 43 MRN: [id number] Neck Size: 14.50  CLINICAL INFORMATION Sleep Study Type: HST Indication for sleep study: OSA Epworth Sleepiness Score:   SLEEP STUDY TECHNIQUE A multi-channel overnight portable sleep study was performed. The channels recorded were: nasal airflow, thoracic respiratory movement, and oxygen saturation with a pulse oximetry. Snoring was also monitored.  MEDICATIONS Patient self administered medications include: none reported.  SLEEP ARCHITECTURE Patient was studied for 363.5 minutes. The sleep efficiency was 100.0 % and the patient was supine for 0%. The arousal index was 0.0 per hour.  RESPIRATORY PARAMETERS The overall AHI was 45.7 per hour, with a central apnea index of 0 per hour. The oxygen nadir was 79% during sleep.  CARDIAC DATA Mean heart rate during sleep was 79.7 bpm.  IMPRESSIONS - Severe obstructive sleep apnea occurred during this study (AHI = 45.7/h). - Oxygen desaturation was noted during this study (Min O2 = 79%). Mean 95% - Patient snored.  DIAGNOSIS - Obstructive Sleep Apnea (G47.33)  RECOMMENDATIONS - Suggest CPAP titration sleep study or autopap. Other options would be based on clinical judgment. - Be careful with alcohol, sedatives and other CNS depressants that may worsen sleep apnea and disrupt normal sleep architecture. - Sleep hygiene should be reviewed to assess factors that may improve sleep quality. - Weight management and regular exercise should be initiated or continued.  [Electronically signed] 02/02/2022 03:09 PM  Baird Lyons MD, [place], American Board of Sleep Medicine NPI: [id number]                         [place], [place] [facility]  ELECTRONICALLY SIGNED ON:  02/02/2022, 3:07 PM [place] PH: [phone]   FX: [phone] [place]

## 2022-02-14 ENCOUNTER — Other Ambulatory Visit: Payer: Self-pay | Admitting: Internal Medicine

## 2022-02-24 ENCOUNTER — Other Ambulatory Visit: Payer: Self-pay | Admitting: Internal Medicine

## 2022-03-25 ENCOUNTER — Other Ambulatory Visit: Payer: Self-pay | Admitting: Internal Medicine

## 2022-04-25 ENCOUNTER — Other Ambulatory Visit: Payer: Self-pay | Admitting: Internal Medicine

## 2022-04-29 ENCOUNTER — Encounter: Payer: Self-pay | Admitting: Internal Medicine

## 2022-04-30 MED ORDER — BETAMETHASONE DIPROPIONATE 0.05 % EX CREA: TOPICAL_CREAM | Freq: Two times a day (BID) | CUTANEOUS | 0 refills | Status: DC

## 2022-04-30 MED ORDER — SELENIUM SULFIDE 1 % EX LOTN: 1.0000 | TOPICAL_LOTION | Freq: Every day | CUTANEOUS | 3 refills | Status: DC

## 2022-05-24 ENCOUNTER — Other Ambulatory Visit: Payer: Self-pay | Admitting: Internal Medicine

## 2022-05-25 ENCOUNTER — Other Ambulatory Visit: Payer: Self-pay | Admitting: Internal Medicine

## 2022-08-24 ENCOUNTER — Other Ambulatory Visit: Payer: Self-pay | Admitting: Internal Medicine

## 2022-08-26 ENCOUNTER — Other Ambulatory Visit: Payer: Self-pay | Admitting: Internal Medicine

## 2022-11-14 ENCOUNTER — Encounter (INDEPENDENT_AMBULATORY_CARE_PROVIDER_SITE_OTHER): Payer: Self-pay

## 2022-11-14 ENCOUNTER — Other Ambulatory Visit: Payer: Self-pay | Admitting: Internal Medicine

## 2022-11-23 ENCOUNTER — Other Ambulatory Visit: Payer: Self-pay | Admitting: Internal Medicine

## 2022-11-27 ENCOUNTER — Other Ambulatory Visit: Payer: Self-pay | Admitting: Internal Medicine

## 2022-12-11 LAB — HM DIABETES EYE EXAM

## 2022-12-13 ENCOUNTER — Other Ambulatory Visit: Payer: Self-pay | Admitting: Internal Medicine

## 2022-12-17 ENCOUNTER — Other Ambulatory Visit: Payer: Self-pay | Admitting: Internal Medicine

## 2022-12-23 ENCOUNTER — Encounter: Payer: Self-pay | Admitting: Internal Medicine

## 2022-12-23 ENCOUNTER — Ambulatory Visit (INDEPENDENT_AMBULATORY_CARE_PROVIDER_SITE_OTHER): Payer: No Typology Code available for payment source | Admitting: Internal Medicine

## 2022-12-23 VITALS — BP 122/82 | HR 98 | Temp 98.3°F | Ht 65.0 in | Wt 260.0 lb

## 2022-12-23 DIAGNOSIS — E118 Type 2 diabetes mellitus with unspecified complications: Secondary | ICD-10-CM

## 2022-12-23 DIAGNOSIS — E1169 Type 2 diabetes mellitus with other specified complication: Secondary | ICD-10-CM | POA: Diagnosis not present

## 2022-12-23 DIAGNOSIS — Z0000 Encounter for general adult medical examination without abnormal findings: Secondary | ICD-10-CM

## 2022-12-23 DIAGNOSIS — Z7984 Long term (current) use of oral hypoglycemic drugs: Secondary | ICD-10-CM

## 2022-12-23 DIAGNOSIS — L659 Nonscarring hair loss, unspecified: Secondary | ICD-10-CM

## 2022-12-23 DIAGNOSIS — E785 Hyperlipidemia, unspecified: Secondary | ICD-10-CM

## 2022-12-23 DIAGNOSIS — G4733 Obstructive sleep apnea (adult) (pediatric): Secondary | ICD-10-CM | POA: Diagnosis not present

## 2022-12-23 DIAGNOSIS — I10 Essential (primary) hypertension: Secondary | ICD-10-CM

## 2022-12-23 LAB — MICROALBUMIN / CREATININE URINE RATIO
Creatinine,U: 17.9 mg/dL
Microalb Creat Ratio: 3.9 mg/g (ref 0.0–30.0)
Microalb, Ur: <0.7 mg/dL (ref 0.0–1.9)

## 2022-12-23 LAB — CBC
HCT: 45.2 % (ref 36.0–46.0)
Hemoglobin: 14.2 g/dL (ref 12.0–15.0)
MCHC: 31.4 g/dL (ref 30.0–36.0)
MCV: 86.4 fl (ref 78.0–100.0)
Platelets: 350 10*3/uL (ref 150.0–400.0)
RBC: 5.23 Mil/uL — ABNORMAL HIGH (ref 3.87–5.11)
RDW: 14.9 % (ref 11.5–15.5)
WBC: 6.6 10*3/uL (ref 4.0–10.5)

## 2022-12-23 LAB — COMPREHENSIVE METABOLIC PANEL
ALT: 22 U/L (ref 0–35)
AST: 22 U/L (ref 0–37)
Albumin: 3.9 g/dL (ref 3.5–5.2)
Alkaline Phosphatase: 103 U/L (ref 39–117)
BUN: 12 mg/dL (ref 6–23)
CO2: 28 mEq/L (ref 19–32)
Calcium: 9.4 mg/dL (ref 8.4–10.5)
Chloride: 100 mEq/L (ref 96–112)
Creatinine, Ser: 0.82 mg/dL (ref 0.40–1.20)
GFR: 83.02 mL/min (ref >60.00)
Glucose, Bld: 166 mg/dL — ABNORMAL HIGH (ref 70–99)
Potassium: 3.6 mEq/L (ref 3.5–5.1)
Sodium: 137 mEq/L (ref 135–145)
Total Bilirubin: 0.4 mg/dL (ref 0.2–1.2)
Total Protein: 7.6 g/dL (ref 6.0–8.3)

## 2022-12-23 LAB — LIPID PANEL
Cholesterol: 213 mg/dL — ABNORMAL HIGH (ref 0–200)
HDL: 50.1 mg/dL (ref >39.00)
LDL Cholesterol: 111 mg/dL — ABNORMAL HIGH (ref 0–99)
NonHDL: 163.14
Total CHOL/HDL Ratio: 4
Triglycerides: 260 mg/dL — ABNORMAL HIGH (ref 0.0–149.0)
VLDL: 52 mg/dL — ABNORMAL HIGH (ref 0.0–40.0)

## 2022-12-23 LAB — TSH: TSH: 2.11 u[IU]/mL (ref 0.35–5.50)

## 2022-12-23 LAB — HEMOGLOBIN A1C: Hgb A1c MFr Bld: 7.4 % — ABNORMAL HIGH (ref 4.6–6.5)

## 2022-12-23 MED ORDER — FLUCONAZOLE 150 MG PO TABS: 150.0000 mg | ORAL_TABLET | ORAL | 3 refills | Status: DC

## 2022-12-23 NOTE — Progress Notes (Unsigned)
Subjective:   Patient ID: Karen Peters, female    DOB: [birth year], 52 y.o.   MRN: [id number]  HPI The patient is here for physical.  PMH, [facility], social history reviewed and updated  Review of Systems  Constitutional: Negative.   HENT: Negative.    Eyes: Negative.   Respiratory:  Negative for cough, chest tightness and shortness of breath.   Cardiovascular:  Negative for chest pain, palpitations and leg swelling.  Gastrointestinal:  Negative for abdominal distention, abdominal pain, constipation, diarrhea, nausea and vomiting.  Musculoskeletal: Negative.   Skin: Negative.   Neurological: Negative.   Psychiatric/Behavioral: Negative.      Objective:  Physical Exam Constitutional:      Appearance: She is well-developed. She is obese.  HENT:     Head: Normocephalic and atraumatic.  Cardiovascular:     Rate and Rhythm: Normal rate and regular rhythm.  Pulmonary:     Effort: Pulmonary effort is normal. No respiratory distress.     Breath sounds: Normal breath sounds. No wheezing or rales.  Abdominal:     General: Bowel sounds are normal. There is no distension.     Palpations: Abdomen is soft.     Tenderness: There is no abdominal tenderness. There is no rebound.  Musculoskeletal:     Cervical back: Normal range of motion.  Skin:    General: Skin is warm and dry.  Neurological:     Mental Status: She is alert and oriented to person, place, and time.     Coordination: Coordination normal.     Vitals:   12/23/22 1103  BP: 122/82  Pulse: 98  Temp: 98.3 F (36.8 C)  TempSrc: Oral  SpO2: 95%  Weight: 260 lb (117.9 kg)  Height: 5\' 5"  (1.651 m)    Assessment & Plan:

## 2022-12-24 ENCOUNTER — Encounter: Payer: Self-pay | Admitting: Internal Medicine

## 2022-12-25 ENCOUNTER — Telehealth: Payer: Self-pay

## 2022-12-25 DIAGNOSIS — L659 Nonscarring hair loss, unspecified: Secondary | ICD-10-CM | POA: Insufficient documentation

## 2022-12-25 NOTE — Assessment & Plan Note (Signed)
Checking lipid panel and adjust as needed her pravastatin 20 mg daily.

## 2022-12-25 NOTE — Assessment & Plan Note (Signed)
Scalp itching and hair thinning desires to see derm. Referral placed.

## 2022-12-25 NOTE — Assessment & Plan Note (Signed)
Flu shot will get. Shingrix complete. Tetanus due. Cologuard up to date. Mammogram up to date with gyn, pap smear up to date with gyn. Counseled about sun safety and mole surveillance. Counseled about the dangers of distracted driving. Given 10 year screening recommendations.

## 2022-12-25 NOTE — Telephone Encounter (Signed)
Closing this encounter out his was an error no note needed

## 2022-12-25 NOTE — Assessment & Plan Note (Signed)
BP at goal on lisinopril/hydrochlorothiazide 20/12.5 mg daily. Checking CMP and adjust as needed.

## 2022-12-25 NOTE — Assessment & Plan Note (Signed)
BMI 43 and counseled about diet/exercise to help.

## 2022-12-25 NOTE — Assessment & Plan Note (Signed)
Needs new device order placed today.

## 2022-12-25 NOTE — Assessment & Plan Note (Signed)
Checking lipid panel, foot exam done, eye exam reminded. Checking HGA1c and microalbumin to creatinine ratio. Adjust as needed her metformin 500 mg daily and jardiance 10 mg daily. Is on ACE-I and statin.

## 2022-12-29 ENCOUNTER — Other Ambulatory Visit: Payer: Self-pay | Admitting: Internal Medicine

## 2022-12-30 MED ORDER — EMPAGLIFLOZIN 10 MG PO TABS: 10.0000 mg | ORAL_TABLET | Freq: Every day | ORAL | 0 refills | Status: DC

## 2023-01-01 ENCOUNTER — Encounter: Payer: Self-pay | Admitting: Internal Medicine

## 2023-01-03 MED ORDER — EMPAGLIFLOZIN 25 MG PO TABS: 25.0000 mg | ORAL_TABLET | Freq: Every day | ORAL | 2 refills | Status: DC

## 2023-01-19 ENCOUNTER — Other Ambulatory Visit: Payer: Self-pay | Admitting: Internal Medicine

## 2023-01-20 ENCOUNTER — Other Ambulatory Visit: Payer: Self-pay | Admitting: Internal Medicine

## 2023-01-27 ENCOUNTER — Other Ambulatory Visit: Payer: Self-pay | Admitting: Internal Medicine

## 2023-01-28 ENCOUNTER — Other Ambulatory Visit: Payer: Self-pay | Admitting: Internal Medicine

## 2023-02-01 ENCOUNTER — Other Ambulatory Visit: Payer: Self-pay | Admitting: Internal Medicine

## 2023-04-16 ENCOUNTER — Other Ambulatory Visit: Payer: Self-pay | Admitting: Internal Medicine

## 2023-04-16 ENCOUNTER — Other Ambulatory Visit: Payer: Self-pay

## 2023-07-13 ENCOUNTER — Other Ambulatory Visit: Payer: Self-pay | Admitting: Internal Medicine

## 2023-07-14 ENCOUNTER — Other Ambulatory Visit: Payer: Self-pay | Admitting: Internal Medicine

## 2023-09-09 ENCOUNTER — Ambulatory Visit: Admitting: Internal Medicine

## 2023-09-09 ENCOUNTER — Encounter: Payer: Self-pay | Admitting: Internal Medicine

## 2023-09-09 ENCOUNTER — Ambulatory Visit: Payer: Self-pay | Admitting: Internal Medicine

## 2023-09-09 VITALS — BP 124/82 | HR 82 | Temp 98.2°F | Ht 65.0 in | Wt 259.4 lb

## 2023-09-09 DIAGNOSIS — E1169 Type 2 diabetes mellitus with other specified complication: Secondary | ICD-10-CM | POA: Diagnosis not present

## 2023-09-09 DIAGNOSIS — E785 Hyperlipidemia, unspecified: Secondary | ICD-10-CM

## 2023-09-09 DIAGNOSIS — I10 Essential (primary) hypertension: Secondary | ICD-10-CM

## 2023-09-09 DIAGNOSIS — E118 Type 2 diabetes mellitus with unspecified complications: Secondary | ICD-10-CM

## 2023-09-09 DIAGNOSIS — Z7984 Long term (current) use of oral hypoglycemic drugs: Secondary | ICD-10-CM

## 2023-09-09 LAB — COMPREHENSIVE METABOLIC PANEL WITH GFR
ALT: 26 U/L (ref 0–35)
AST: 22 U/L (ref 0–37)
Albumin: 4.1 g/dL (ref 3.5–5.2)
Alkaline Phosphatase: 115 U/L (ref 39–117)
BUN: 10 mg/dL (ref 6–23)
CO2: 30 meq/L (ref 19–32)
Calcium: 9.2 mg/dL (ref 8.4–10.5)
Chloride: 99 meq/L (ref 96–112)
Creatinine, Ser: 0.71 mg/dL (ref 0.40–1.20)
GFR: 98.19 mL/min (ref >60.00)
Glucose, Bld: 158 mg/dL — ABNORMAL HIGH (ref 70–99)
Potassium: 3.9 meq/L (ref 3.5–5.1)
Sodium: 135 meq/L (ref 135–145)
Total Bilirubin: 0.4 mg/dL (ref 0.2–1.2)
Total Protein: 7.9 g/dL (ref 6.0–8.3)

## 2023-09-09 LAB — LIPID PANEL
Cholesterol: 187 mg/dL (ref 0–200)
HDL: 44 mg/dL (ref >39.00)
LDL Cholesterol: 114 mg/dL — ABNORMAL HIGH (ref 0–99)
NonHDL: 143.47
Total CHOL/HDL Ratio: 4
Triglycerides: 148 mg/dL (ref 0.0–149.0)
VLDL: 29.6 mg/dL (ref 0.0–40.0)

## 2023-09-09 LAB — CBC
HCT: 44.2 % (ref 36.0–46.0)
Hemoglobin: 14.4 g/dL (ref 12.0–15.0)
MCHC: 32.6 g/dL (ref 30.0–36.0)
MCV: 81.1 fl (ref 78.0–100.0)
Platelets: 331 10*3/uL (ref 150.0–400.0)
RBC: 5.44 Mil/uL — ABNORMAL HIGH (ref 3.87–5.11)
RDW: 16.7 % — ABNORMAL HIGH (ref 11.5–15.5)
WBC: 5.8 10*3/uL (ref 4.0–10.5)

## 2023-09-09 LAB — MAGNESIUM: Magnesium: 2.1 mg/dL (ref 1.5–2.5)

## 2023-09-09 LAB — HEMOGLOBIN A1C: Hgb A1c MFr Bld: 7.9 % — ABNORMAL HIGH (ref 4.6–6.5)

## 2023-09-09 MED ORDER — RYBELSUS 7 MG PO TABS: 7.0000 mg | ORAL_TABLET | Freq: Every day | ORAL | 0 refills | Status: AC

## 2023-09-09 MED ORDER — RYBELSUS 14 MG PO TABS: 14.0000 mg | ORAL_TABLET | Freq: Every day | ORAL | 1 refills | Status: DC

## 2023-09-09 NOTE — Progress Notes (Signed)
   Subjective:   Patient ID: Karen Peters, female    DOB: December 30, 1970, 53 y.o.   MRN: [id number]  HPI The patient is a 53 YO female coming in for medical management (see A/P).   Review of Systems  Constitutional: Negative.   HENT: Negative.    Eyes: Negative.   Respiratory:  Negative for cough, chest tightness and shortness of breath.   Cardiovascular:  Negative for chest pain, palpitations and leg swelling.  Gastrointestinal:  Negative for abdominal distention, abdominal pain, constipation, diarrhea, nausea and vomiting.  Musculoskeletal: Negative.   Skin: Negative.   Neurological: Negative.   Psychiatric/Behavioral: Negative.      Objective:  Physical Exam Constitutional:      Appearance: She is well-developed.  HENT:     Head: Normocephalic and atraumatic.   Cardiovascular:     Rate and Rhythm: Normal rate and regular rhythm.  Pulmonary:     Effort: Pulmonary effort is normal. No respiratory distress.     Breath sounds: Normal breath sounds. No wheezing or rales.  Abdominal:     General: Bowel sounds are normal. There is no distension.     Palpations: Abdomen is soft.     Tenderness: There is no abdominal tenderness. There is no rebound.   Musculoskeletal:     Cervical back: Normal range of motion.   Skin:    General: Skin is warm and dry.   Neurological:     Mental Status: She is alert and oriented to person, place, and time.     Coordination: Coordination normal.     Vitals:   09/09/23 1028  BP: 124/82  Pulse: 82  Temp: 98.2 F (36.8 C)  SpO2: 93%  Weight: 259 lb 6.4 oz (117.7 kg)  Height: 5' 5 (1.651 m)    Assessment & Plan:

## 2023-09-12 ENCOUNTER — Encounter: Payer: Self-pay | Admitting: Internal Medicine

## 2023-09-12 NOTE — Assessment & Plan Note (Signed)
 Checking lipid panel and adjust pravastatin as needed.

## 2023-09-12 NOTE — Assessment & Plan Note (Signed)
BP at goal on lisinopril/hydrochlorothiazide 20/12.5 mg daily. Checking CMP and adjust as needed.

## 2023-09-12 NOTE — Assessment & Plan Note (Signed)
 BMI >40 and starting rybelsus  to help.

## 2023-09-12 NOTE — Assessment & Plan Note (Signed)
 We will check HgA1c and adjust as needed. Plan to start rybelsus  7 mg daily 1 month then 14 mg daily. If HgA1c good we will replace jardiance  and if not controlled add to.

## 2023-10-11 ENCOUNTER — Other Ambulatory Visit: Payer: Self-pay | Admitting: Internal Medicine

## 2023-10-12 ENCOUNTER — Other Ambulatory Visit: Payer: Self-pay | Admitting: Internal Medicine

## 2023-10-22 ENCOUNTER — Other Ambulatory Visit: Payer: Self-pay | Admitting: Internal Medicine

## 2023-10-29 ENCOUNTER — Other Ambulatory Visit: Payer: Self-pay | Admitting: Internal Medicine

## 2023-11-24 ENCOUNTER — Encounter: Admitting: Internal Medicine

## 2023-12-09 ENCOUNTER — Encounter: Payer: Self-pay | Admitting: Internal Medicine

## 2023-12-10 NOTE — Telephone Encounter (Signed)
 Copied from CRM [id number]. Topic: Clinical - Medical Advice >> Dec 10, 2023  8:16 AM Anairis L wrote: Reason for CRM: Urgent care Sunday 12/07/2023.Had a urine culture. Pt wants to know if she should come in or ER, because they informed her she need IV antibiotics. Please return patient call.

## 2023-12-10 NOTE — Telephone Encounter (Signed)
 FYI

## 2023-12-22 ENCOUNTER — Ambulatory Visit (INDEPENDENT_AMBULATORY_CARE_PROVIDER_SITE_OTHER): Admitting: Internal Medicine

## 2023-12-22 ENCOUNTER — Encounter: Payer: Self-pay | Admitting: Internal Medicine

## 2023-12-22 VITALS — BP 132/80 | HR 99 | Temp 98.3°F | Ht 65.0 in | Wt 256.0 lb

## 2023-12-22 DIAGNOSIS — N3001 Acute cystitis with hematuria: Secondary | ICD-10-CM

## 2023-12-22 DIAGNOSIS — R300 Dysuria: Secondary | ICD-10-CM

## 2023-12-22 LAB — POCT URINALYSIS DIPSTICK
Bilirubin, UA: NEGATIVE
Blood, UA: POSITIVE
Glucose, UA: POSITIVE — AB
Ketones, UA: NEGATIVE
Leukocytes, UA: NEGATIVE
Nitrite, UA: NEGATIVE
Protein, UA: NEGATIVE
Spec Grav, UA: 1.01 (ref 1.010–1.025)
Urobilinogen, UA: NEGATIVE U/dL — AB
pH, UA: 6 (ref 5.0–8.0)

## 2023-12-22 NOTE — Patient Instructions (Signed)
 We will send in the culture.

## 2023-12-22 NOTE — Progress Notes (Unsigned)
   Subjective:   Patient ID: Willma Glow, female    DOB: 03-Sep-1970, 53 y.o.   MRN: [id number]  Discussed the use of AI scribe software for clinical note transcription with the patient, who gave verbal consent to proceed.  History of Present Illness Emery Binz is a 53 year old female who presents for follow-up after antibiotic treatment for a urinary tract infection.  She initially presented to urgent care with symptoms of a urinary tract infection, including dysuria and malaise, persisting for about five days without fever.  She was initially prescribed an ineffective antibiotic based on her urine culture, and was subsequently switched to Augmentin, which led to symptom improvement. She is concerned about the possibility of needing further treatment if the bacteria persist.  She is currently taking Jardiance , which may increase her risk for urinary infections due to increased glycosuria. She recently went on a seven-day cruise, during which she gained some weight, but has since lost three pounds. Her blood sugar levels have improved, remaining normal even after eating.  No fever, new abdominal pain, or dysuria. No recent issues with urination.     Review of Systems  Objective:  Physical Exam  Vitals:   12/22/23 1504  BP: 132/80  Pulse: 99  Temp: 98.3 F (36.8 C)  TempSrc: Oral  SpO2: 97%  Weight: 256 lb (116.1 kg)  Height: 5' 5 (1.651 m)    Assessment and Plan Assessment & Plan Urinary tract infection   Initially treated with an ineffective antibiotic, Augmentin was prescribed to target the identified bacteria, leading to symptom improvement. Jardiance  increases UTI risk by elevating urinary sugar levels. Send urine for culture to confirm bacterial eradication. Consider urologist referral if bacteria persist. Monitor for recurrent UTIs.

## 2023-12-23 ENCOUNTER — Encounter: Payer: Self-pay | Admitting: Internal Medicine

## 2023-12-23 DIAGNOSIS — N3001 Acute cystitis with hematuria: Secondary | ICD-10-CM | POA: Insufficient documentation

## 2023-12-23 NOTE — Assessment & Plan Note (Signed)
 Recent infection with 3 different bacteria. Susceptibilities reviewed from urgent care and she was placed on augmentin which was appropriate but not highly active against all 3. She is feeling better. U/A done in office without typical signs of infection will send for culture.

## 2024-01-07 ENCOUNTER — Encounter: Admitting: Internal Medicine

## 2024-01-14 ENCOUNTER — Other Ambulatory Visit: Payer: Self-pay | Admitting: Internal Medicine

## 2024-01-16 ENCOUNTER — Encounter: Admitting: Internal Medicine

## 2024-01-27 ENCOUNTER — Encounter: Payer: Self-pay | Admitting: Internal Medicine

## 2024-01-27 ENCOUNTER — Ambulatory Visit: Admitting: Internal Medicine

## 2024-01-27 VITALS — BP 130/80 | HR 84 | Temp 98.8°F | Ht 65.0 in | Wt 255.6 lb

## 2024-01-27 DIAGNOSIS — Z0000 Encounter for general adult medical examination without abnormal findings: Secondary | ICD-10-CM | POA: Diagnosis not present

## 2024-01-27 DIAGNOSIS — E118 Type 2 diabetes mellitus with unspecified complications: Secondary | ICD-10-CM

## 2024-01-27 DIAGNOSIS — Z23 Encounter for immunization: Secondary | ICD-10-CM

## 2024-01-27 DIAGNOSIS — I10 Essential (primary) hypertension: Secondary | ICD-10-CM

## 2024-01-27 DIAGNOSIS — E1169 Type 2 diabetes mellitus with other specified complication: Secondary | ICD-10-CM | POA: Diagnosis not present

## 2024-01-27 DIAGNOSIS — E785 Hyperlipidemia, unspecified: Secondary | ICD-10-CM

## 2024-01-27 DIAGNOSIS — Z7984 Long term (current) use of oral hypoglycemic drugs: Secondary | ICD-10-CM

## 2024-01-27 LAB — MICROALBUMIN / CREATININE URINE RATIO
Creatinine,U: 33.5 mg/dL
Microalb Creat Ratio: UNDETERMINED mg/g (ref 0.0–30.0)
Microalb, Ur: <0.7 mg/dL

## 2024-01-27 LAB — COMPREHENSIVE METABOLIC PANEL WITH GFR
ALT: 37 U/L — ABNORMAL HIGH (ref 0–35)
AST: 20 U/L (ref 0–37)
Albumin: 3.8 g/dL (ref 3.5–5.2)
Alkaline Phosphatase: 101 U/L (ref 39–117)
BUN: 7 mg/dL (ref 6–23)
CO2: 26 meq/L (ref 19–32)
Calcium: 9.1 mg/dL (ref 8.4–10.5)
Chloride: 100 meq/L (ref 96–112)
Creatinine, Ser: 0.67 mg/dL (ref 0.40–1.20)
GFR: 100.67 mL/min (ref >60.00)
Glucose, Bld: 107 mg/dL — ABNORMAL HIGH (ref 70–99)
Potassium: 3.6 meq/L (ref 3.5–5.1)
Sodium: 135 meq/L (ref 135–145)
Total Bilirubin: 0.4 mg/dL (ref 0.2–1.2)
Total Protein: 7.5 g/dL (ref 6.0–8.3)

## 2024-01-27 LAB — HEMOGLOBIN A1C: Hgb A1c MFr Bld: 6.5 % (ref 4.6–6.5)

## 2024-01-27 MED ORDER — BETAMETHASONE DIPROPIONATE 0.05 % EX CREA: TOPICAL_CREAM | Freq: Two times a day (BID) | CUTANEOUS | 3 refills | Status: AC

## 2024-01-27 MED ORDER — OMEPRAZOLE 20 MG PO CPDR
20.0000 mg | DELAYED_RELEASE_CAPSULE | Freq: Every day | ORAL | 3 refills | Status: AC
Start: 2024-01-27 — End: ?

## 2024-01-27 NOTE — Progress Notes (Unsigned)
   Subjective:   Patient ID: Karen Peters, female    DOB: 1971-02-24, 53 y.o.   MRN: [id number]  The patient is here for physical. Pertinent topics discussed: Discussed the use of AI scribe software for clinical note transcription with the patient, who gave verbal consent to proceed.  History of Present Illness   PMH, [facility], social history reviewed and updated  Review of Systems  Objective:  Physical Exam  Vitals:   01/27/24 1051  BP: 130/80  Pulse: 84  Temp: 98.8 F (37.1 C)  TempSrc: Oral  SpO2: 98%  Weight: 255 lb 9.6 oz (115.9 kg)  Height: 5' 5 (1.651 m)    Assessment & Plan:

## 2024-01-29 NOTE — Assessment & Plan Note (Signed)
 Weight is stable and working on further weight loss. Counseled about diet and exercise.

## 2024-01-29 NOTE — Assessment & Plan Note (Addendum)
 Checking UACR, HgA1c, CMP, urine culture. Adjust regimen as needed. Foot exam done.

## 2024-01-29 NOTE — Assessment & Plan Note (Signed)
 Flu shot up to date. Pneumonia due next year. Shingrix complete. Tetanus done today. Colonoscopy up to date. Mammogram up to date, pap smear up to date. Counseled about sun safety and mole surveillance. Counseled about the dangers of distracted driving. Given 10 year screening recommendations.

## 2024-01-29 NOTE — Assessment & Plan Note (Signed)
 Recent lipid panel and continue current regimen.

## 2024-01-29 NOTE — Assessment & Plan Note (Signed)
 BP at goal checking CMP and adjust as needed regimen.

## 2024-01-30 ENCOUNTER — Ambulatory Visit: Payer: Self-pay | Admitting: Internal Medicine

## 2024-01-31 LAB — URINE CULTURE

## 2024-04-08 ENCOUNTER — Other Ambulatory Visit: Payer: Self-pay | Admitting: Internal Medicine

## 2024-04-09 ENCOUNTER — Other Ambulatory Visit: Payer: Self-pay | Admitting: Internal Medicine

## 2024-04-10 ENCOUNTER — Other Ambulatory Visit: Payer: Self-pay | Admitting: Internal Medicine

## 2024-04-23 ENCOUNTER — Other Ambulatory Visit: Payer: Self-pay | Admitting: Internal Medicine
